# Patient Record
Sex: Male | Born: 1960 | Race: White | Hispanic: No | Marital: Married | Smoking: Former smoker
Health system: Western US, Academic
[De-identification: ages and names within clinical notes are randomized; demographics above are authoritative.]

## PROBLEM LIST (undated history)

## (undated) DIAGNOSIS — I10 Essential (primary) hypertension: Secondary | ICD-10-CM

## (undated) DIAGNOSIS — E079 Disorder of thyroid, unspecified: Secondary | ICD-10-CM

## (undated) DIAGNOSIS — R569 Unspecified convulsions: Secondary | ICD-10-CM

## (undated) DIAGNOSIS — K219 Gastro-esophageal reflux disease without esophagitis: Secondary | ICD-10-CM

## (undated) DIAGNOSIS — F319 Bipolar disorder, unspecified: Secondary | ICD-10-CM

## (undated) HISTORY — PX: CERVICAL SPINE SURGERY: SHX589

## (undated) HISTORY — PX: BACK SURGERY: SHX140

## (undated) HISTORY — PX: OTHER SURGICAL HISTORY: SHX169

---

## 2012-11-09 ENCOUNTER — Ambulatory Visit: Payer: Medicare Other | Attending: NEUROLOGY | Admitting: NEUROLOGY

## 2012-11-09 VITALS — BP 116/78 | HR 93 | Temp 96.5°F | Resp 20 | Ht 68.0 in | Wt 211.0 lb

## 2012-11-09 DIAGNOSIS — Z9889 Other specified postprocedural states: Secondary | ICD-10-CM | POA: Insufficient documentation

## 2012-11-09 DIAGNOSIS — G40919 Epilepsy, unspecified, intractable, without status epilepticus: Secondary | ICD-10-CM | POA: Insufficient documentation

## 2012-11-09 MED ORDER — ZONISAMIDE 100 MG CAPSULE
200.0000 mg | ORAL_CAPSULE | Freq: Two times a day (BID) | ORAL | Status: DC
Start: 2012-11-09 — End: 2013-03-21

## 2012-11-09 MED ORDER — LEVETIRACETAM 1,000 MG TABLET
1.0000 | ORAL_TABLET | Freq: Two times a day (BID) | ORAL | Status: DC
Start: 2012-11-09 — End: 2012-11-18

## 2012-11-09 MED ORDER — DIVALPROEX 500 MG TABLET,DELAYED RELEASE
1000.0000 mg | DELAYED_RELEASE_TABLET | Freq: Two times a day (BID) | ORAL | Status: DC
Start: 2012-11-09 — End: 2013-03-28

## 2012-11-09 NOTE — Addendum Note (Signed)
Addended by: Lovie Macadamia on: 11/09/2012 06:45 PM     Modules accepted: Orders

## 2012-11-09 NOTE — Nursing Note (Signed)
>>   [facility]     Tue Nov 09, 2012  2:39 PM  Vital signs taken, allergies verified, screened for pain. Pt is a new pt, no refills are needed at this time. Oliver Hum MA II

## 2012-11-09 NOTE — Progress Notes (Addendum)
PATIENT:  Peter Deleon  MRN:         [id number]  DATE:        11/09/2012  [address]  [place] [address]  23-Apr-1960  [phone] (home)     PCP: Stephannie Peters, MD  [address]  [place] [address]    Dear  Dr. Stephannie Peters, MD,     Our mutual patient, Khup Sapia, was seen today at the [facility] in new referral for epilepsy and establish care at [facility].    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 Helene Shoe. Patient is here to establish care for epilepsy and higher level of care for managing the VNS. Patient currently cannot use the magnet now because of a coughing fit, causing the VNS to be unusable. Also feels very drowsy all day (which patient believes is probably from depakote).     In 1964, patient was 24 months old and had his first seizure. Patient had strept throat 3 months earlier and thought to have led to seizures. Parents described tremoring on the L side of body. This was treated with PHB but "did not tolerate it". Patient was unmedicated for 5 years afterwards because seizure may not have been recognized. Later childhood, patient felt something was wrong and described "drifting off", "dizzy", "seeing things out of the body". During his events, patient could remember what was spoken and knows the answer, but cannot respond. Postictally, he has a great need to sleep.     Per wife, before VNS, patient had blank stare and would clench hands, then would clap them. After VNS, patient grinds teeth and has a blank stare. The wife has noted that his L hand seems like it is grabbing things in the beginning rather than the R hand. VNS has stopped bouts of seizures from 3-4 to 1 now.     Currently, patient has monthly seizures lasting 20 seconds.     1. Seizure risk factors:    Complication of birth or early development:  strept throat  Significant head trauma: No   Febrile convulsion: No.   CNS infection: No   Family history of epilepsy: No.   Brain tumor: No.   Stroke: No.   Prior neurosurgery: No    2. Seizure triggers:     Extreme stress   Lack of sleep   Sick   Hot temperatures    3.  Auras   Prior to seizure, he senses terror several hours, different tastes, dream-like state, foggy surroundings.     5. AED history:     Depakote 1000 mg BID   Keppra 1000 mg BID   Zonegran 200 mg BID    5. Failed AED history:     PHB - unsure why it was stopped   Dilantin - unsure why it was stopped   Tegretol - not effective   mysolin   lamictal - unsure why it was stopped     Has not tried topamax    Per OSH report, patient has had an EEG 12/2009 which was reportedly normal but no records are available. EEG 11/2011 was abnormal showing left hemispheric sharp and slow activity with phase reversal. He has been offered L temporal epilepsy surgery in the past but decided against it. This was offered by Dr. Isaiah Serge of [facility].  Patient was afraid of losing his memory and language because a relative had the same problem and had complications afterwards.     REVIEW OF SYSTEMS:  ROS was performed and is negative except for those items noted in the HPI and any additional items noted here:   Chest pain with VNS - 2 years. Present all the time but not triggered by VNS turning on.     PAST MEDICAL & SURGICAL HISTORY:  No past medical history on file.  No past surgical history on file.  Spondylolisthesis  Classic migranie  Dermatophytosis  Diverticulosis  Hyperplasia of prostate  Lumbar disk rupture  Major depressive disorder  Otitis  Panic disorder  Hearing loss  Bipolar    MEDICATIONS:  No current outpatient prescriptions on file prior to visit.     No current facility-administered medications on file prior to visit.   depakote 1000 mg BID  Fluvoxamine 50 mg 1 T am and 2T pm  keppra 500 mg BID  Melatonin 3 mg daily  Nabumetone 750 mg BID  Omeprazole 20  daily  Venlafaxine 75 mg daily  zonegran 200 mg BID    ALLERGIES:  Allergies not on file    FAMILY HISTORY:  Maternal great grandmother with seizure  Mother:  Deceased from lung cancer    SOCIAL HISTORY:   History     Social History    Marital Status: MARRIED     Spouse Name: N/A     Number of Children: N/A    Years of Education: N/A     Occupational History    Not on file.     Social History Main Topics    Smoking status: Not on file    Smokeless tobacco: Not on file    Alcohol Use: Not on file    Drug Use: Not on file    Sexually Active: Not on file     Other Topics Concern    Not on file     Social History Narrative    No narrative on file   no alcohol, no drugs, former smoking > 10 years    EXAMINATION:    Vitals:  Temp: 35.8 C (96.5 F) (09/23 1438)  Temp src: Tympanic (09/23 1438)  Pulse: 93 (09/23 1438)  BP: 116/78 mmHg (09/23 1438)  Resp: 20 (09/23 1438)  SpO2: --  Height: 172.7 cm (5\' 8" ) (09/23 1438)  Weight: 95.7 kg (210 lb 15.7 oz) (09/23 1438)    General Physical Exam:  GENERAL: NAD  HEENT: MMM. OP clear. No carotid bruits. Neck supple. Scar on the R from neck surgery. Scar on the R from VNS leads.  HEART: RRR, no murmurs/rubs/gallops  Chest: VNS scar on the L,     Neurological Exam:  Mental status  State: Alert, oriented to self, place, time. Appropriate affect and behavior.  Use of language: Fluent output, comprehension intact, repetition intact    CRANIAL NERVES:  CN 2:        Pupils 39mm->3mm, equal round reactive to light.  Visual fields intact to confrontation testing bilaterally.    CN 3,4,6:  EOM intact with 8-9 beats of horizontal nystagmus.  CN 5:        Sensation to LT intact bilaterally.  Sensation to pinprick intact bilaterally.  Masseter strength full bilaterally.  CN 7:        Symmetric smile, brow raise, and eye closure.  Eye closure strength full.  CN 8:        Hearing  symmetric to finger rub.  CN 9, 10:  Palate elevation midline.  CN 11:      SCM strength full bilaterally.   Trapezius strength full bilaterally.  CN 12:      Tongue protrusion midline.     Motor:  Bulk: No atrophy  Tone: Normal tone, no spasticity or rigidity   Pronator drift: none     Deltoid Biceps Triceps BR Wrist Flexors Wrist Extensors Hand Intrinsics Grip   R 5/5 5/5 5/5 5/5 5/5 5/5 5/5 5/5   L 5/5 5/5 5/5 5/5 5/5 5/5 5/5 5/5      Hip Flexors Hip Ext Knee Flexors Knee Ext Dorsi- Flexion Plantar- Flexion   R 5/5 5/5 5/5 5/5 5/5 5/5   L 5/5 5/5 5/5 5/5 5/5 5/5     Sensory:  Light touch:  Intact in all extremities  Vibration:  Intact in distal extremities  Temperature:  Intact in all extremities    Reflexes:   Biceps Triceps BR  Patella   R 2+ 2+ 2+ 2+   L 2+ 2+ 2+ 2+     Coordination:  F-to-N:  Intention tremors  H-to-S:  Intact bilaterally    Gait/Stance:  Romberg Sign:  positive  Gait: rises without difficulty, normal stable narrow based gait, symmetric steps, balance intact  Tandem gait: unable     DIAGNOSTIC STUDIES:  No results found for any previous visit.  Vagal Nerve Stimulator Interrogation, 11/09/2012 @ 3:09 PM    Settings  Output current:  1.00 mA  Signal frequency:   30 Hz  Pulse width:  130 uSec  On time:  30 sec    Off time:  5.0 min     Magnet triggered current:  1.25 mA  Magnet on time:  30 sec  Magnet pulse width:  500 uSec    System Diagnostics  Communication:  ok  Output status:  ok  Lead impedence:  Ok 3255 ohms  Output current:  Ok 1.0 mA    Magnet was activated and patient had violent coughing for 30 seconds    New Settings 11/09/2012   Output current:  1.00 mA  Signal frequency:   30 Hz  Pulse width:  130 uSec  On time:  30 sec    Off time:  5.0 min     Magnet triggered current:  0.75 mA  Magnet on time:  30 sec  Magnet pulse width:  250 uSec      SUMMARY & IMPRESSION:  This is a 52yr-old right-handed male with a past medical history of bipolar disorder, depression, back surgery with titanium rod, neck surgery with bone grafts, epilepsy s/p VNS placement in 11/04/10 who is here to establish care  for medically intractable epilepsy. VNS was modified today to tolerable magnet levels. 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 Extensive discussions about the benefits and risks of resection were made with the patient. However, he is hesitant. Patient will let us know if he wants to pursue a surgical workup at this point. In the meantime, patient will try to obtain outside records (MRI and EEG).      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   - blood tests including VPA, KEP, ZNG levels, CBC, LFT, BMP  - outpatient routine EEG  - outpatient MRI brain seizure protocol (titanium back rods are ok as they are not ferrous)    Return to clinic in 3 month(s).    Patient was seen and discussed with attending Dr. Trilby Drummer who agrees with the above stated findings and plan of care.     Sincerely,  Lovie Macadamia, MD  Resident  Department of Neurology    Neurology Attending    This patient was seen, evaluated, and care plan was developed with the resident. I agree with the findings and plan as outlined in the resident's note above.    Teodora Medici MD

## 2012-11-17 ENCOUNTER — Telehealth: Payer: Self-pay | Admitting: NEUROLOGY

## 2012-11-17 NOTE — Telephone Encounter (Signed)
Patient calling in to advise doctor that he gave mistakenly reported the wrong dosage for Keppra.  Patient states that he in fact takes 500mg  2x day to equal 1000mg  daily and NOT 1000mg  2x day to equal 2000mg  a day.  Patient states he is cutting tabs in half to keep dosage at 1000mg  daily.    Best number to be reached [phone]    Gerilyn Nestle

## 2012-11-18 MED ORDER — LEVETIRACETAM 500 MG TABLET
500.0000 mg | ORAL_TABLET | Freq: Two times a day (BID) | ORAL | Status: DC
Start: 2012-11-18 — End: 2013-03-21

## 2012-11-18 NOTE — Telephone Encounter (Signed)
Call to patient, he would like to clarify that at office visit he stated he the wrong dose of Keppra he had been taking. He is taking Keppra 500 mg BID. Dr. Lissa Merlin had ordered Keppra 1000mg  BID. Patient states he is cutting the keppra in half to take 500 mg BID. He wanted Dr. Doyle Askew to ne aware.Kathyrn Lass

## 2012-11-18 NOTE — Telephone Encounter (Signed)
Thank you for notifying. I have made the changes.

## 2012-11-25 ENCOUNTER — Encounter: Payer: Self-pay | Admitting: NURSE PRACTITIONER

## 2012-11-25 NOTE — Progress Notes (Signed)
Received copy of labs from [facility] drawn 11/17/12 at 10:01 am    CBC w/diff:  WNL  BCP WNL  LFT's WNL  VPA level 49  ZNS pending  LEV pending

## 2012-12-02 ENCOUNTER — Telehealth: Payer: Self-pay | Admitting: NEUROLOGY

## 2012-12-02 NOTE — Telephone Encounter (Signed)
Patient calling in to request orders for EEG and MRI be sent to [facility] in [place]. [phone]    Gerilyn Nestle

## 2012-12-02 NOTE — Telephone Encounter (Signed)
Faxed EEG Order to [facility] ph: [phone] fax: [phone].  Received confirmation that fax has been received.

## 2012-12-06 NOTE — Telephone Encounter (Signed)
Received a call from [facility] EEG coordinator stating they to not do outpatient EEGs.  Contacted and spoke to patient informing him of [facility]'s response.  Patient would like to try and find a facility near home.  Mailed Routine EEG order to patient to take to different facilities near home.  Confirmed patient's address and placed order in outgoing mail.

## 2012-12-20 ENCOUNTER — Telehealth: Payer: Self-pay | Admitting: NEUROLOGY

## 2012-12-20 NOTE — Telephone Encounter (Signed)
Patient calling in to request a replacement magnet for his VNS.  Requesting it be sent to him home in [place] if this is possible.        Gerilyn Nestle

## 2012-12-21 NOTE — Telephone Encounter (Addendum)
Please mail VNS magnet box to pt's home.  Thanks, Raynelle Fanning

## 2012-12-24 NOTE — Telephone Encounter (Signed)
Magnet placed in outgoing mail.Peter Deleon

## 2013-03-21 ENCOUNTER — Telehealth: Payer: Self-pay | Admitting: NEUROLOGY

## 2013-03-21 MED ORDER — ZONISAMIDE 100 MG CAPSULE
200.0000 mg | ORAL_CAPSULE | Freq: Two times a day (BID) | ORAL | Status: DC
Start: 2013-03-21 — End: 2014-07-14

## 2013-03-21 MED ORDER — LEVETIRACETAM 500 MG TABLET
500.0000 mg | ORAL_TABLET | Freq: Two times a day (BID) | ORAL | Status: DC
Start: 2013-03-21 — End: 2014-03-16

## 2013-03-21 NOTE — Telephone Encounter (Signed)
This is a  REFILL AUTHORIZATION for zonisamide 100 mg cap, 2 caps BID.  Pt's name and birthdate verified.  Pt was last seen by Dr. Trilby DrummerSeyal on 11/09/12. Next appointment is not yet scheduled for followup. Last office visit and medication list reviewed. Prescription filled for #240, with  6 refills.  Prescription sent to [facility] in [place].    This is a  REFILL AUTHORIZATION for levetiracetam 500 mg , 1 BID.  Pt's name and birthdate verified.  Last office visit and medication list reviewed. Prescription filled for #60, with  11 refills.  Prescription sent to [facility] in [place].    Bernadene PersonJanet Bess Saltzman, RN

## 2013-03-28 ENCOUNTER — Telehealth: Payer: Self-pay | Admitting: NEUROLOGY

## 2013-03-28 MED ORDER — DIVALPROEX 500 MG TABLET,DELAYED RELEASE
1000.0000 mg | DELAYED_RELEASE_TABLET | Freq: Two times a day (BID) | ORAL | Status: DC
Start: 2013-03-28 — End: 2014-04-18

## 2013-03-28 NOTE — Telephone Encounter (Signed)
This is a  REFILL AUTHORIZATION for depakote 500 mg delayed release tab, 2 tab BID.  Pt's name and birthdate verified.  Pt was last seen by Dr. Trilby DrummerSeyal on 11/09/12. Next appointment is not yet scheduled for 3 month followup. Last office visit and medication list reviewed. Prescription filled for #120, with  11 refills.  Prescription sent to [facility] in [place].    Bernadene PersonJanet Brick Ketcher, RN

## 2013-06-23 ENCOUNTER — Ambulatory Visit: Payer: Medicare Other | Attending: NURSE PRACTITIONER | Admitting: NURSE PRACTITIONER

## 2013-06-23 ENCOUNTER — Ambulatory Visit (HOSPITAL_BASED_OUTPATIENT_CLINIC_OR_DEPARTMENT_OTHER)
Admission: RE | Admit: 2013-06-23 | Discharge: 2013-06-23 | Disposition: A | Discharge status: Home / Self Care | Payer: Medicare Other | Source: Ambulatory Visit | Attending: NEUROLOGY | Admitting: NEUROLOGY

## 2013-06-23 ENCOUNTER — Ambulatory Visit: Payer: Medicare Other

## 2013-06-23 ENCOUNTER — Encounter: Payer: Self-pay | Admitting: NURSE PRACTITIONER

## 2013-06-23 VITALS — BP 127/90 | HR 92 | Temp 96.4°F | Resp 16 | Ht 68.0 in | Wt 210.3 lb

## 2013-06-23 DIAGNOSIS — G40909 Epilepsy, unspecified, not intractable, without status epilepticus: Secondary | ICD-10-CM | POA: Insufficient documentation

## 2013-06-23 DIAGNOSIS — R569 Unspecified convulsions: Principal | ICD-10-CM

## 2013-06-23 DIAGNOSIS — Z462 Encounter for fitting and adjustment of other devices related to nervous system and special senses: Secondary | ICD-10-CM | POA: Insufficient documentation

## 2013-06-23 MED ORDER — GADODIAMIDE 5 MMOL/10 ML (287 MG/ML) INTRAVENOUS SYRINGE
10.0000 mL | INJECTION | INTRAVENOUS | Status: AC
Start: 2013-06-23 — End: 2013-06-23
  Administered 2013-06-23: 10 mL via INTRAVENOUS

## 2013-06-23 NOTE — Progress Notes (Addendum)
Pt has had MRI brain. Needs VNS reactivated.    Vagal Nerve Stimulator Settings:  0.0 output currentReset to 1.0 mA   30 signal frequency  130 pulse width  30 on time (sec)  5.0 off time (min)  0.0 Magnet output current Reset to 0.75 mA   30 Magnet on time  250 Magnet pulse width    Pt tolerated settings without difficulty.    5 minutes spent adjusting VNS device.    Dani GobbleJulie Ann Witt Plitt, NP

## 2013-06-23 NOTE — Progress Notes (Addendum)
Pt here for MRI brain.  Needs VNS inactivated for scan.    Vagal Nerve Stimulator Settings:  1.0 output current   Reset to 0.0 mA before scan  30 signal frequency  130 pulse width  30 on time (sec)  5.0 off time (min)  0.75 Magnet output current  Reset to 0.0 mA before scan  30 Magnet on time  250 Magnet pulse width    5 minutes spent adjusting VNS.  Dani GobbleJulie Ann Annastyn Silvey, NP

## 2013-06-23 NOTE — Nursing Note (Signed)
>>   [facility]     Thu Jun 23, 2013 10:10 AM  Vital signs taken, allergies verified, screened for pain. Oliver HumKandis [name] MA II

## 2013-06-24 ENCOUNTER — Telehealth: Payer: Self-pay | Admitting: Neurology

## 2013-06-24 NOTE — Telephone Encounter (Signed)
Called patient to tell him that his MRI exam was abnormal. He did not pick up so I left a message for him to call back our clinic on Monday regarding his MRI results. I wanted to discuss that his MRI showed some structural problem with his right hippocampus and amygdala. In our last clinic visit on 11/09/12, we suspected that he had L temporal epilepsy and may be a candidate for surgical resection. He may need EEG for better clarification of his epilepsy focus, but I will defer his management to Dr. Trilby DrummerSeyal.     Wanda PlumpJon Tyronica Truxillo MD

## 2013-06-27 ENCOUNTER — Telehealth: Payer: Self-pay | Admitting: Neurology

## 2013-06-27 NOTE — Telephone Encounter (Addendum)
Received a call from the patient in regards to the message below,Advised the patient will Page  the physician once we receive a call back clinic will  contact the patient once clinic receive a call with physician on the line   Page  was made at 8:56 am  pending a call back from the physician  At this time.  Thank you,  Revonda StandardMaria Elena Rojas  [facility]  Referral coordinator [facility]    [phone]  [phone]

## 2013-06-27 NOTE — Telephone Encounter (Signed)
Patient called back and I discussed that there is an abnormality on his recent MRI (R hippocampus and amygdala). He tells me that his seizures start with L sided funny feeling or fear which would suggest a R temporal seizure and not a L temporal seizure. He is complaining of sedation and cognitive issues with his current regimen. I broached the subject of surgery but patient is not interested.  I asked him to schedule a follow up visit with the epilepsy clinic.

## 2013-08-23 ENCOUNTER — Encounter: Payer: Self-pay | Admitting: NEUROLOGY

## 2013-09-20 ENCOUNTER — Ambulatory Visit: Payer: Medicare Other | Admitting: NEUROLOGY

## 2013-11-22 ENCOUNTER — Ambulatory Visit: Payer: Medicare Other | Attending: NEUROLOGY | Admitting: NEUROLOGY

## 2013-11-22 ENCOUNTER — Ambulatory Visit: Payer: Medicare Other

## 2013-11-22 VITALS — BP 128/86 | HR 85 | Temp 96.6°F | Resp 16 | Ht 68.0 in | Wt 223.1 lb

## 2013-11-22 DIAGNOSIS — G40219 Localization-related (focal) (partial) symptomatic epilepsy and epileptic syndromes with complex partial seizures, intractable, without status epilepticus: Secondary | ICD-10-CM | POA: Insufficient documentation

## 2013-11-22 DIAGNOSIS — Z79899 Other long term (current) drug therapy: Secondary | ICD-10-CM | POA: Insufficient documentation

## 2013-11-22 DIAGNOSIS — G40919 Epilepsy, unspecified, intractable, without status epilepticus: Principal | ICD-10-CM

## 2013-11-22 LAB — HEPATIC FUNCTION PANEL
ALANINE TRANSFERASE (ALT): 30 U/L (ref 6–63)
ALBUMIN: 4.1 g/dL (ref 3.4–4.8)
ALKALINE PHOSPHATASE (ALP): 61 U/L (ref 35–115)
ASPARTATE TRANSAMINASE (AST): 24 U/L (ref 15–43)
BILIRUBIN TOTAL: 0.5 mg/dL (ref 0.3–1.3)
PROTEIN: 6.9 g/dL (ref 6.3–8.3)

## 2013-11-22 LAB — VALPROATE: VALPROATE: 63 mg/L (ref 50–100)

## 2013-11-22 MED ORDER — CALCIUM 500 MG (AS CARBONATE)-VITAMIN D3 5 MCG (200 UNIT) TABLET
1.0000 | ORAL_TABLET | Freq: Two times a day (BID) | ORAL | Status: AC
Start: 2013-11-22 — End: 2014-11-17

## 2013-11-22 NOTE — Nursing Note (Signed)
Vital signs taken, allergies verified, screened for pain. Preferred pharmacy selected. Refills needed? No.Radonna Bracher MA II

## 2013-11-22 NOTE — Progress Notes (Addendum)
PATIENT:  Peter Deleon  MRN:         [id number]  DATE:        11/22/2013  [address]  [place] [address]  22-Nov-1960  [phone] (home)     PCP: Stephannie Peters, MD  [address]  [place] [address]    Dear  Dr. Stephannie Peters, MD,     Our mutual patient, Peter Deleon, was seen today at the [facility] as a follow up for seizure management. He was last seen at this clinic on 11/10/12. He was driven to clinic by a friend.    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 Helene Shoe. Patient is here as a follow up for seizure management.     In 1964, patient was 53 months old and had his first seizure with tremoring on the L side of body. This was treated with PHB but he "did not tolerate it". Patient was unmedicated for 5 years afterwards because seizure may not have been recognized. Later childhood, patient felt something was wrong and described "drifting off", "dizzy", "seeing things out of the body". During his events, patient could remember what was spoken and knows the answer, but cannot respond. Postictally, he has a great need to sleep. After several medication changes and installation of VNS, his seizure clusters were dropped from 3-4/month to 1/month now. Each cluster has 3-4 seizures spread out over 3-4 days.    On 06/24/13, patient called in regarding an abnormal MRI brain report. I discussed there is R hippocampus and amygdala. He tells me that his seizures start with a funny feeling over the L sided. He also has an OSH EEG 11/2011 which showed abnormal slowing of the left hemispheric sharp and slow activity with phase reversal. He has been offered L temporal epilepsy surgery in the past but decided against it. This was offered by Dr. Isaiah Serge of [facility]. Patient was afraid of losing his memory and language because a relative had the  same problem and had complications afterwards.     In the interim, from last year's visit, patient continues to have monthly seizures. VNS is used "a lot" in the past couple days because he feels like there is an impending seizure. He has a sense of anxiety and fear of "being alienated", then suddenly removed from his usual place. Once seizures begin, he has a sense of terror and it is too late to try the VNS. Patient can hear things and remember things in "slow motion."  He can try to talk but it comes out mumbled. Witnesses say both hands wring and move, swallowing, grinding teeth, eyes move back and forth, no particular eye deviation.     He is concerned that he is sleeping an "awful lot", feeling tired and sleepy throughout day. Snores throughout day. Falls asleep during activities.     1. Seizure risk factors:   Complication of birth or early development: strept throat  Significant head trauma: No   Febrile convulsion: No.   CNS infection: No   Family history of epilepsy: No.   Brain tumor: No.   Stroke: No.   Prior neurosurgery: No    2. Seizure triggers:   Extreme stress  Lack of sleep  Sick  Hot temperatures    3. Auras  Prior to seizure, he  senses terror several hours, different tastes, dream-like state, foggy surroundings.     5. AED history:    Depakote 1000 mg BID  Keppra 1000 mg BID  Zonegran 200 mg BID    5. Failed AED history:    PHB - unsure why it was stopped  Dilantin - unsure why it was stopped  Tegretol - not effective  mysolin  lamictal - unsure why it was stopped  Has not tried topamax    Per OSH report, patient has had an EEG 12/2009 which was reportedly normal but no records are available. EEG 11/2011 was abnormal showing left hemispheric sharp and slow activity with phase reversal. He has been offered L temporal epilepsy surgery in the past but decided against it. This was offered by Dr. Isaiah SergeBertram of [facility]. Patient was afraid of losing his memory and language because a relative had the same  problem and had complications afterwards.     REVIEW OF SYSTEMS:  ROS was performed and is negative except for those items noted in the HPI and any additional items noted here:     PAST MEDICAL & SURGICAL HISTORY:  Spondylolisthesis  Classic migranie  Dermatophytosis  Diverticulosis  Hyperplasia of prostate  Lumbar disk rupture  Major depressive disorder  Otitis  Panic disorder  Hearing loss  Bipolar    MEDICATIONS:  Current Outpatient Prescriptions on File Prior to Visit   Medication Sig Dispense Refill    Divalproex (DEPAKOTE) 500 mg Delayed Release Tablet Take 2 tablets by mouth 2 times daily. 120 tablet 11    LevETIRAcetam (KEPPRA) 500 mg Tablet Take 1 tablet by mouth 2 times daily. 60 tablet 11    Zonisamide (ZONISAMIDE) 100 mg Capsule Take 2 capsules by mouth 2 times daily. 240 capsule 6     No current facility-administered medications on file prior to visit.   depakote 1000 mg BID  keppra 500 mg BID  zonegran 200 mg BID    levoxyl 75 mg daily  relafen 750 mg bid  prilosec 20 mg  flomax 0.4 mg  Abilify 2 mg daily - started 6 months ago  Luvox 50 mg daily  effexor 100 daily, 50 mg daily - started 1 year ago  Tylenol 3  MVI    ALLERGIES:  Allergies   Allergen Reactions    Pcn (Penicillin) [Penicillins] Rash    Vicodin [Hydrocodone-Acetaminophen] Anxiety       FAMILY HISTORY:  No family history on file.  Maternal great grandmother with seizure  Mother: Deceased from lung cancer    SOCIAL HISTORY:   History     Social History    Marital Status: MARRIED     Spouse Name: N/A     Number of Children: N/A    Years of Education: N/A     Occupational History    Not on file.     Social History Main Topics    Smoking status: Former Smoker    Smokeless tobacco: Former NeurosurgeonUser      Comment: Quit cigs 13 years ago, quit smokeless tobacco 30 years ago    Alcohol Use: Not on file    Drug Use: Not on file    Sexual Activity: Not on file     Other Topics Concern    Not on file     Social History Narrative    No  narrative on file   no alcohol, no drugs, former smoking > 10 years    EXAMINATION:    Vitals:  Temp:  35.9 C (96.6 F) (10/06 1040)  Temp src: Tympanic (10/06 1040)  Pulse: 85 (10/06 1040)  BP: 128/86 mmHg (10/06 1040)  Resp: 16 (10/06 1040)  SpO2: --  Height: 172.7 cm (5\' 8" ) (10/06 1040)  Weight: 101.2 kg (223 lb 1.7 oz) (10/06 1040)    General Physical Exam:  GENERAL: NAD  HEENT: MMM. OP clear. No carotid bruits. Neck supple. Scar on the R from neck surgery. Scar on the R from VNS leads.  HEART: RRR, no murmurs/rubs/gallops  Chest: VNS scar on the L chest    Neurological Exam:  Mental status  State: Alert, oriented to self, place, time. Appropriate affect and behavior.  Use of language: Fluent output, comprehension intact, repetition intact    CRANIAL NERVES:  CN 2: Pupils 22mm->3mm, equal round reactive to light. Visual fields intact to confrontation testing bilaterally.   CN 3,4,6: EOM intact with 8-9 beats of horizontal nystagmus.  CN 5: Sensation to LT intact bilaterally. Sensation to pinprick intact bilaterally. Masseter strength full bilaterally.  CN 7: Symmetric smile, brow raise, and eye closure. Eye closure strength full.  CN 8: Hearing symmetric to finger rub.  CN 9, 10: Palate elevation midline.  CN 11: SCM strength full bilaterally. Trapezius strength full bilaterally.  CN 12: Tongue protrusion midline.     Motor:  Bulk: No atrophy  Tone: Normal tone, no spasticity or rigidity   Fine tremors in the hands with arms outstretched  Pronator drift: none   Deltoid  Biceps  Triceps  BR  Wrist Flexors  Wrist Extensors  Hand Intrinsics  Grip    R  5/5  5/5  5/5  5/5  5/5  5/5  5/5  5/5    L  5/5  5/5  5/5  5/5  5/5  5/5  5/5  5/5     Hip Flexors  Hip Ext  Knee Flexors  Knee Ext  Dorsi- Flexion  Plantar- Flexion    R  5/5  5/5  5/5  5/5  5/5  5/5    L  5/5  5/5  5/5  5/5  5/5  5/5      Sensory:  Light touch: Intact in all extremities  Vibration: Intact in distal extremities  Temperature: Intact in all  extremities    Reflexes:   Biceps  Triceps  BR  Patella    R  2+  2+  2+  2+    L  2+  2+  2+  2+      Coordination:  F-to-N: Intention tremors  H-to-S: Intact bilaterally    Gait/Stance:  Romberg Sign: positive  Gait: rises without difficulty, normal stable narrow based gait, symmetric steps, balance intact  Tandem gait: unable       DIAGNOSTIC STUDIES:  No results found for any previous visit.  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    VPA 49 on OSH lab test 11/29/12    Adverse events risk score 60    NDDI-E 14    SUMMARY & IMPRESSION:  This  is a 53yr-old right-handed male with a past medical history of bipolar disorder, depression, back surgery with titanium rod, neck surgery with bone grafts, epilepsy s/p VNS placement in 11/04/10 who is here for management of his epilepsy. Patient still having 3-4 seizures a month but they are clustered in 3-4 consecutive days. He suffers no side effects from the AEDs except for mild intention tremors. Several AEDs have been attempted in the past with varying success.  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 Patient is interested in surgery at this point. We discussed in detail the steps that are needed for resection today. Patient will follow up with [place] after those tests are completed.   History also suggests possible OSA.    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     AAN EPILEPSY QUALITY MEASURES REVIEW:    MEASURE FREQUENCY DATE LAST DOCUMENTED  COMMENT   1. Seizure type & current seizure frequency All visits 11/22/2013     2. Documentation of etiology of epilepsy or epilepsy syndrome All visits 11/22/2013     3. EEG results reviewed, requested, or test ordered Initial visits 11/22/2013     4. MRI/CT scan reviewed, requested, or scan ordered Initial visits 11/22/2013     5. Querying and counseling about antiepileptic drug side effects All visits 11/22/2013     6. Surgical therapy referral consideration for intractable epilepsy q3 years for patients with intractable epilepsy 11/22/2013     7. Counseling about epilepsy specific safety issues q1 year minimal 11/22/2013     8. Counseling for women of childbearing potential with epilepsy q1 year minimal for women age 1-44 na      Return to clinic in 3 month(s).    Patient was seen and discussed with attending Dr. Trilby Drummer who agrees with the above stated findings and plan of care.     Sincerely,  Lovie Macadamia, MD  Resident  Department of Neurology      Neurology Attending    This patient was seen, evaluated, and care plan was developed with the resident. I agree with the findings and plan as outlined in the resident's note above. The total visit time today was 30 minutes and more than 50% of the time was spent in counselling the patient regarding his diagnosis and future management.    Teodora Medici MD

## 2013-11-25 LAB — KEPPRA (LEVETIRACETAM)

## 2013-11-25 LAB — ZONISAMIDE: ZONISAMIDE: 12 ug/mL

## 2013-12-02 ENCOUNTER — Telehealth: Payer: Self-pay | Admitting: NEUROLOGY

## 2013-12-02 NOTE — Telephone Encounter (Signed)
Patient calling in to advise he has an appointment today at 11am.  He needs a clearance for dental work stating it is okay for the procedure to be done with his VNS implant.  Patient states the office has faxed over a release 3 days ago.  Please contact [facility] [phone]      Gerilyn NestleLeah Brown, [facility]  Neurology, [facility]  [phone] patient line  [phone] backline

## 2013-12-05 NOTE — Telephone Encounter (Signed)
Patient calling in to check on status of clearance forms for dentist.  Patient states he has an appointment tomorrow, Tuesday at 11am.  Advised Dr. Trilby DrummerSeyal will be in clinic on Tuesday and NP, Raynelle FanningJulie will talk with doctor for signature.  Also advised patient of timeframe for requesting forms to be completed.      Gerilyn NestleLeah Brown, [facility]  Neurology, [facility]  [phone] patient line  [phone] backline

## 2013-12-06 NOTE — Telephone Encounter (Signed)
Please notify pt that dental clearance letter faxed to his dental office. Thanks, Raynelle FanningJulie

## 2013-12-06 NOTE — Telephone Encounter (Signed)
Patient notified      Leah Brown, [facility]  Neurology, [facility]  [phone] patient line  [phone] backline

## 2014-01-27 ENCOUNTER — Telehealth: Payer: Self-pay | Admitting: NEUROLOGY

## 2014-01-27 NOTE — Telephone Encounter (Addendum)
Contacted and spoke to patient offering to schedule the Video EEG.  Patient asked if this is part of the surgical evaluation.  Told patient yes.  Patient stated that he has decided not to have the surgery at this time, seizures well controlled with Implant.  Also told patient that a VET is also used for diagnostic purposes to help establish location of electrical brain waves.  I also told patient that we can hold off on scheduling if he wanted to talk to Dr. Trilby DrummerSeyal. Patient did state that this would be a helpful tool in the treatment.  Patient requested May 15, 2014.

## 2014-03-14 ENCOUNTER — Telehealth: Payer: Self-pay | Admitting: NEUROLOGY

## 2014-03-14 NOTE — Telephone Encounter (Signed)
Patient called and rescheduled the admission date from May 15, 2014 to Jul 17, 2014. Stating his spouse is having major surgery and will not be able make the appts with him.  He also wants to be availablee to help her in her recovery.

## 2014-04-18 ENCOUNTER — Other Ambulatory Visit: Payer: Self-pay | Admitting: NURSE PRACTITIONER

## 2014-04-18 MED ORDER — DIVALPROEX 500 MG TABLET,DELAYED RELEASE
1000.0000 mg | DELAYED_RELEASE_TABLET | Freq: Two times a day (BID) | ORAL | Status: DC
Start: 2014-04-18 — End: 2014-07-14

## 2014-04-18 NOTE — Telephone Encounter (Signed)
This is a  REFILL AUTHORIZATION for the faxed request for prescription Divalproex 500 mg, 2 tabs BID.      [name] RN/neurology

## 2014-04-26 ENCOUNTER — Ambulatory Visit: Payer: Medicare Other | Admitting: Clinical Neuropsychologist

## 2014-05-08 ENCOUNTER — Other Ambulatory Visit: Payer: Self-pay | Admitting: NEUROLOGY

## 2014-05-08 NOTE — Telephone Encounter (Signed)
This is a  REFILL AUTHORIZATION for Levetiracetam ( keppra)500mg  tablet, take 1 tablet orally BID.  Pt's name and birthdate verified. Pt was last seen by Dr. Lissa MerlinKuo on 11-22-13. Next appointment is not yet scheduled for 3 months f/u. Last office visit and medication list reviewed. Prescription filled for # 180 tablets, with  1 refills.  Prescription sent to [facility].  Medication refilled per Prescription Refill by Clinic RN Standardized Procedure Policy II-31.    TC to patient for clarification because last oV note : "cont Keppra 1000 mg BID ".  He verified 500mg  BID.    [facility] - please schedule for follow up or enlist in cancellation list. Please reinforce need to keep appointment. Kennon RoundsSally Kaileb Monsanto,RN/Neurology         Kennon RoundsSally Arlys Scatena,RN/Neurology

## 2014-07-10 ENCOUNTER — Inpatient Hospital Stay
Admission: RE | Admit: 2014-07-10 | Discharge: 2014-07-15 | DRG: 101 | Disposition: A | Discharge status: Home / Self Care | Payer: Medicare Other | Source: Ambulatory Visit | Attending: Neurology | Admitting: Neurology

## 2014-07-10 ENCOUNTER — Inpatient Hospital Stay
Admission: RE | Admit: 2014-07-10 | Discharge: 2014-07-10 | Disposition: A | Discharge status: Home / Self Care | Payer: Self-pay | Source: Ambulatory Visit

## 2014-07-10 DIAGNOSIS — Z87891 Personal history of nicotine dependence: Secondary | ICD-10-CM | POA: Insufficient documentation

## 2014-07-10 DIAGNOSIS — G40909 Epilepsy, unspecified, not intractable, without status epilepticus: Secondary | ICD-10-CM

## 2014-07-10 DIAGNOSIS — G4733 Obstructive sleep apnea (adult) (pediatric): Secondary | ICD-10-CM | POA: Diagnosis present

## 2014-07-10 DIAGNOSIS — Q048 Other specified congenital malformations of brain: Secondary | ICD-10-CM | POA: Insufficient documentation

## 2014-07-10 DIAGNOSIS — Z9689 Presence of other specified functional implants: Secondary | ICD-10-CM | POA: Diagnosis present

## 2014-07-10 DIAGNOSIS — F319 Bipolar disorder, unspecified: Secondary | ICD-10-CM | POA: Diagnosis present

## 2014-07-10 DIAGNOSIS — G40919 Epilepsy, unspecified, intractable, without status epilepticus: Principal | ICD-10-CM | POA: Diagnosis present

## 2014-07-10 LAB — BASIC METABOLIC PANEL
CALCIUM: 9.4 mg/dL (ref 8.6–10.5)
CARBON DIOXIDE TOTAL: 23 meq/L — AB (ref 24–32)
CHLORIDE: 105 meq/L (ref 95–110)
CREATININE BLOOD: 0.97 mg/dL (ref 0.44–1.27)
GLUCOSE: 117 mg/dL — AB (ref 70–99)
POTASSIUM: 4.2 meq/L (ref 3.3–5.0)
SODIUM: 139 meq/L (ref 135–145)
UREA NITROGEN, BLOOD (BUN): 16 mg/dL (ref 8–22)

## 2014-07-10 LAB — VALPROATE: VALPROATE: 40 mg/L — AB (ref 50–100)

## 2014-07-10 LAB — CBC WITH DIFFERENTIAL
BASOPHILS % AUTO: 0.3 %
BASOPHILS ABS AUTO: 0 10*3/uL (ref 0–0.2)
EOSINOPHIL % AUTO: 4.7 %
EOSINOPHIL ABS AUTO: 0.3 10*3/uL (ref 0–0.5)
HEMATOCRIT: 43.8 % (ref 41–53)
HEMOGLOBIN: 14.9 g/dL (ref 13.5–17.5)
LYMPHOCYTE ABS AUTO: 2.1 10*3/uL (ref 1.0–4.8)
LYMPHOCYTES % AUTO: 31 %
MCH: 29.9 pg (ref 27–33)
MCHC: 34.1 % (ref 32–36)
MCV: 87.7 UM3 (ref 80–100)
MONOCYTES % AUTO: 8.4 %
MONOCYTES ABS AUTO: 0.6 10*3/uL (ref 0.1–0.8)
MPV: 9.3 UM3 (ref 6.8–10.0)
NEUTROPHIL ABS AUTO: 3.8 10*3/uL (ref 1.80–7.70)
NEUTROPHILS % AUTO: 55.6 %
PLATELET COUNT: 175 10*3/uL (ref 130–400)
RDW: 13.7 U (ref 0–14.7)
RED CELL COUNT: 5 10*6/uL (ref 4.5–5.9)
WHITE BLOOD CELL COUNT: 6.8 10*3/uL (ref 4.5–11.0)

## 2014-07-10 LAB — HEPATIC FUNCTION PANEL
ALANINE TRANSFERASE (ALT): 41 U/L (ref 6–63)
ALBUMIN: 3.8 g/dL (ref 3.4–4.8)
ALKALINE PHOSPHATASE (ALP): 67 U/L (ref 35–115)
ASPARTATE TRANSAMINASE (AST): 34 U/L (ref 15–43)
BILIRUBIN DIRECT: 0.2 mg/dL (ref 0.0–0.2)
BILIRUBIN TOTAL: 0.8 mg/dL (ref 0.3–1.3)
PROTEIN: 6.2 g/dL — AB (ref 6.3–8.3)

## 2014-07-10 LAB — MAGNESIUM (MG): MAGNESIUM (MG): 2.1 mg/dL (ref 1.5–2.6)

## 2014-07-10 LAB — AMMONIA: AMMONIA: 91 umol/L — AB (ref 2–30)

## 2014-07-10 LAB — PHOSPHORUS (PO4): PHOSPHORUS (PO4): 3.4 mg/dL (ref 2.4–5.0)

## 2014-07-10 MED ORDER — VENLAFAXINE 37.5 MG TABLET
150.0000 mg | ORAL_TABLET | Freq: Every day | ORAL | Status: DC
Start: 2014-07-11 — End: 2014-07-15
  Administered 2014-07-11 – 2014-07-15 (×5): 150 mg via ORAL
  Filled 2014-07-10 (×5): qty 4

## 2014-07-10 MED ORDER — ZONISAMIDE 100 MG CAPSULE
100.0000 mg | ORAL_CAPSULE | Freq: Two times a day (BID) | ORAL | Status: DC
Start: 2014-07-10 — End: 2014-07-11
  Administered 2014-07-10 – 2014-07-11 (×2): 100 mg via ORAL
  Filled 2014-07-10 (×2): qty 1

## 2014-07-10 MED ORDER — LEVOTHYROXINE 75 MCG TABLET
75.0000 ug | ORAL_TABLET | Freq: Every day | ORAL | Status: DC
Start: 2014-07-11 — End: 2014-07-15
  Administered 2014-07-11 – 2014-07-15 (×5): 75 ug via ORAL
  Filled 2014-07-10 (×5): qty 1

## 2014-07-10 MED ORDER — LORAZEPAM 2 MG/ML INJECTION SOLUTION
1.0000 mg | INTRAMUSCULAR | Status: DC | PRN
Start: 2014-07-10 — End: 2014-07-15

## 2014-07-10 MED ORDER — CALCIUM 500 MG (AS CARBONATE)-VITAMIN D3 5 MCG (200 UNIT) TABLET
1.0000 | ORAL_TABLET | Freq: Two times a day (BID) | ORAL | Status: DC
Start: 2014-07-10 — End: 2014-07-15
  Administered 2014-07-10 – 2014-07-15 (×10): 1 via ORAL
  Filled 2014-07-10 (×9): qty 1

## 2014-07-10 MED ORDER — NAPROXEN 500 MG TABLET
500.0000 mg | ORAL_TABLET | Freq: Three times a day (TID) | ORAL | Status: DC | PRN
Start: 2014-07-10 — End: 2014-07-15

## 2014-07-10 MED ORDER — PANTOPRAZOLE 20 MG TABLET,DELAYED RELEASE
20.0000 mg | DELAYED_RELEASE_TABLET | Freq: Every day | ORAL | Status: DC
Start: 2014-07-11 — End: 2014-07-15
  Administered 2014-07-11 – 2014-07-15 (×5): 20 mg via ORAL
  Filled 2014-07-10 (×6): qty 1

## 2014-07-10 MED ORDER — HEPARIN, PORCINE (PF) 5,000 UNIT/0.5 ML INJECTION SYRINGE
5000.0000 [IU] | INJECTION | Freq: Three times a day (TID) | INTRAMUSCULAR | Status: DC
Start: 2014-07-10 — End: 2014-07-15
  Administered 2014-07-10 – 2014-07-14 (×12): 5000 [IU] via SUBCUTANEOUS
  Filled 2014-07-10 (×13): qty 1

## 2014-07-10 MED ORDER — FLUVOXAMINE 50 MG TABLET
50.0000 mg | ORAL_TABLET | Freq: Every day | ORAL | Status: DC
Start: 2014-07-10 — End: 2014-07-14
  Filled 2014-07-10 (×3): qty 1

## 2014-07-10 MED ORDER — DIVALPROEX 250 MG TABLET,DELAYED RELEASE
500.0000 mg | DELAYED_RELEASE_TABLET | Freq: Two times a day (BID) | ORAL | Status: DC
Start: 2014-07-10 — End: 2014-07-11
  Administered 2014-07-10 – 2014-07-11 (×2): 500 mg via ORAL
  Filled 2014-07-10 (×2): qty 2

## 2014-07-10 MED ORDER — DOCUSATE SODIUM 100 MG CAPSULE
100.0000 mg | ORAL_CAPSULE | Freq: Two times a day (BID) | ORAL | Status: DC
Start: 2014-07-10 — End: 2014-07-15
  Administered 2014-07-10 – 2014-07-15 (×9): 100 mg via ORAL
  Filled 2014-07-10 (×11): qty 1

## 2014-07-10 MED ORDER — ACETAMINOPHEN 325 MG TABLET
650.0000 mg | ORAL_TABLET | ORAL | Status: DC | PRN
Start: 2014-07-10 — End: 2014-07-10

## 2014-07-10 MED ORDER — ACETAMINOPHEN 325 MG TABLET
650.0000 mg | ORAL_TABLET | ORAL | Status: DC | PRN
Start: 2014-07-10 — End: 2014-07-15
  Administered 2014-07-11 – 2014-07-14 (×4): 650 mg via ORAL
  Filled 2014-07-10 (×4): qty 2

## 2014-07-10 MED ORDER — LEVETIRACETAM 500 MG TABLET
500.0000 mg | ORAL_TABLET | Freq: Two times a day (BID) | ORAL | Status: DC
Start: 2014-07-10 — End: 2014-07-12
  Administered 2014-07-10 – 2014-07-12 (×4): 500 mg via ORAL
  Filled 2014-07-10 (×4): qty 1

## 2014-07-10 MED ORDER — TAMSULOSIN 0.4 MG CAPSULE
0.4000 mg | ORAL_CAPSULE | Freq: Every day | ORAL | Status: DC
Start: 2014-07-10 — End: 2014-07-15
  Administered 2014-07-10 – 2014-07-14 (×5): 0.4 mg via ORAL
  Filled 2014-07-10 (×5): qty 1

## 2014-07-10 MED ORDER — MULTIVITAMIN TABLET
1.0000 | ORAL_TABLET | Freq: Every day | ORAL | Status: DC
Start: 2014-07-11 — End: 2014-07-15
  Administered 2014-07-11 – 2014-07-15 (×5): 1 via ORAL
  Filled 2014-07-10 (×5): qty 1

## 2014-07-10 MED ORDER — ARIPIPRAZOLE 2 MG TABLET
2.0000 mg | ORAL_TABLET | Freq: Every day | ORAL | Status: DC
Start: 2014-07-11 — End: 2014-07-14
  Administered 2014-07-11: 2 mg via ORAL
  Filled 2014-07-10 (×3): qty 1

## 2014-07-10 MED ORDER — ZONISAMIDE 100 MG CAPSULE
200.0000 mg | ORAL_CAPSULE | Freq: Two times a day (BID) | ORAL | Status: DC
Start: 2014-07-10 — End: 2014-07-10

## 2014-07-10 MED ORDER — DIVALPROEX 250 MG TABLET,DELAYED RELEASE
1000.0000 mg | DELAYED_RELEASE_TABLET | Freq: Two times a day (BID) | ORAL | Status: DC
Start: 2014-07-10 — End: 2014-07-10

## 2014-07-10 NOTE — Nurse Assessment (Signed)
ASSESSMENT NOTE    Note Started: 07/10/2014, 19:42     Initial assessment completed and recorded in EMR.  Report received from day shift nurse and orders reviewed. Pt admitted for continuous video EEG and telemonitoring. Pt has PMH of bipolar disorder, neck and back pain s/p surgery, OSA on nocturnal CPAP, and intractable epilepsy s/p VNS placement in September 2012. Upon assessment pt AOX4, in RA with clear lung sounds, moves all extremities well, ambulatory to the bathroom with constant supervision and steady on his feet, denies any numbness/tingling in extremities but does report lower back pain 4/10- offered pain medication but pt declined. Pt reports has chronic low back pain that stays at about 4/10 level. Pt oriented about seizure protocol- pressing the button and to always have someone go with him to the restroom. Seizure precautions maintained- padded side rails, suction and oxygen connected and functioning, and lights on. Will continue to monitor. Plan of Care reviewed and updated, discussed with patient.  Jake Sharkaissa Bryne Lindon, RN

## 2014-07-10 NOTE — Nurse Assessment (Signed)
ADMIT NURSING NOTE    Note Started: 07/10/2014, 16:56     Report received from EEG tech. Patient admitted at 1440 hours as a direct admit with no sitter at the bedside. Pt condition stable . patient oriented to room and unit. MD aware of patient's arrival on unit. Admission Assessment and Plan of Care initiated. Zebedee IbaMary Reyansh Kushnir, RN

## 2014-07-10 NOTE — Plan of Care (Signed)
Problem: Patient Care Overview (Adult)  Goal: Plan of Care Review  Pt currently having an EEG done at this time. Refused BIPAP as he doesn't use it every day at home. RA 95% No SOB noted.

## 2014-07-10 NOTE — H&P (Addendum)
NEUROLOGY RESIDENT PGY-3 VET ADMISSION H&P    PATIENT:  Peter Deleon  MRN:         [id number]    NOTE DATE / TIME:  07/10/2014  @ 15:24    CHIEF COMPLAINT:  Patient is directly admitted for video EEG telemetry monitoring.    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     Spell History:  Onset: 2618 months of age, 3 months after he suffered streptococcal pharyngitis.   Spell/Aura description(s):      1) Aura consists of stereotyped sensation of being "lost" or "abandoned."  This lasts for about 30 seconds. He also used to have bitter tastes in his mouth when he was younger, but this no longer occurs. Marland Kitchen. He then experiences an "out of body" experience, during which he feels like an "observer." He can feel and hear people, but simultaneously feels dissociated. During his most recent seizure, he started to feel a sensation for intense fear and anxiety with suffocation. Patient has been told by others that he has fidgety hand movements and grinds his teeth, although patient is not aware of this. Patient reports that he thinks he understands what others are saying, but he is slow to respond.     -Frequency: monthly. Prior to VNS placement, patient was having 3-4 seizures per month. VNS was placed by Dr. Helene ShoeBirk in [place] in September of 2012.     -Last occured: 3-4 weeks ago in the setting of missed dose 2 days prior to event.     2) Convulsion: only had one convulsion since he was an infant. It occurred 2 years ago after having back-to-back auras. He is amnestic of the event. His wife took a video which showed back and forth head movements. The video is  unfortunately not available for us to review. His convulsion lasted for abut 4-5 minutes, after which he was taken to a hospital.     Triggers: Stress, lack of sleep, illness, hot temperatures   Driving: patient has a valid driver's license but is not driving.    Seizure risk factors:        History of perinatal hypoxia - no       History of birth or early complications - strep pharyngitis       Developmental delay - no       History of CNS infection - no       History of febrile seizures - no      History of head trauma - no       Family history of seizures - no       History of abuse - no    Prior work up:   - Per prior clinic notes, patient had an OSH EEG 12/2009 which was reportedly normal but no records are available. EEG 11/2011 was abnormal showing left hemispheric sharp and slow activity with phase reversal. He has been offered L temporal epilepsy surgery in the past but decided against it. This was offered by Dr. Isaiah SergeBertram of [facility]. Patient was afraid of losing his memory and language because a relative had the same problem and  had complications afterwards.     Past AED history:  - Phenobarbital :discontinued due to intolerance (although patient does not know exactly why)  - Dilantin: unclear why this was discontinued   - Tegretol: ineffective at high doses   - Mysolin: ineffective, stopped while they were trying to simplify his regimen  - Lamictal: unclear why it was discontinued     Current AEDs (compliance):   He is taking the following medications with good compliance  -Depakote 1000 mg BID  - Keppra 500 mg BID (was previously on a higher dose, but decreased to this dose for unclear reason). Denies any mood related side effects.   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    Mother passed away of lung cancer. She was a smoker.   Maternal great grand mother had seizures, details unknown      SOCIAL HISTORY:   Tobacco: Former smoker. Intermittently smoked 1 PPD since he was teenager up until 15 years ago. Denies drinking. Denies illicit drug use   Occupation:  On disability. Used to work in the [employer] in the [employer]. 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 Visual fields full to confrontation. Fundi clear margins.  CN 3,4,6: EOM intact.  No nystagmus.  CN 5: Sensation to LT in V1-V3. Sensation to pinprick intact. Masseter strength 5/5.  CN 7: Facial symmetry intact. Eye closure strength 5/5.  CN 8: Hearing intact.  CN 9, 10: Palate elevation symmetric.  CN 11: SCM strength 5/5. Trapezius strength 5/5.  CN 12: Tongue protrusion midline.  Motor:  Tone and bulk: normal   Adventitious movements: none   Pronator drift: none   Strength: 5/5 throughout all  extremities    Reflexes:  2+ throughout all extremities,     Sensory:  Light touch: intact  Vibration: intact    Coordination:  F-to-N: intact  DIAGNOSTIC STUDIES:  No results found for this visit on 07/10/14 (from the past 24 hour(s)).    OSH EEG 12/2009 which was reportedly normal but no records are available. EEG 11/2011 was abnormal showing left hemispheric sharp and slow activity with phase reversal.    MRI:  MR CONSISTENT WITH PROBABLE CONGENITAL ARCHITECTURAL ARRAY DISTORTION OF  THE RIGHT HIPPOCAMPUS AND AMYGDALA. 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     It is conceivable that patient's epilepsy may be secondary to the structural abnormalities in the right side of his hippocampus and amygdala seen on MRI, however this would not explain the outside EEG report suggesting L hemispheric epileptogenic abnormalities. We unfortunately do not have any EEGs in our system to review.     Plan to admit patient to further localize and characterize his events.    PLAN:   -Admit to Neurology for video EEG telemetry monitoring  -Plan to decrease current AEDs:    - Depakote  BID ->500mg  BID   - Zonegran  BID ->  BID   - Continue current dose of Keppra  BID for now     -Labs: CBC, CMP, Mg, Phos, ammonia and AED levels    VET lab protocol:  -Seizure  precautions / padded bed rails.  -Door to room to always remain open, sitter must be present 24 hours a day.  -Continuous pulse oximetry with alarm set at 85% saturation or less.   -RN to notify team for generalized convulsive seizure lasting more than 2 minutes,        two or more convulsive seizures within a 60 minute period, or two or more seizures without complete recovery of consciousness beween seizures.  -Ativan  is to be given for generalized convulsive seizures lasting more than 2 minutes, two or more generalized convulsive seizures within any 8 hour period, or three or more seizures of any duration accompanied by alteration of consciousness in any 12 hour period.    F/E/N:  Saline lock, Regular diet. Replete Lytes PRN.  DVT/GI PROPHYLAXIS:  SQH. ALPs. Colace.  CODE STATUS:  Full  DISPOSITION:  Home      [Based on literature "the shortest reported average time for successful, clinically relevant long-term monitoring was 4.8 - 7.6 days" and "video EEG monitoring combined with sleep deprivation and protocolized rapid antiepileptic drug withdrawal is a safe & effective investigative technique with no adverse long-term sequela."  1) Ghougassian, D. F., et al. "Evaluating the Utility of Inpatient Video-EEG Monitoring." Epilepsia 45.8 (2004): 928-32. Web.   2) Rizvi, S. A.a., et al. "Is Rapid Withdrawal of Anti-epileptic Drug Therapy during Video EEG Monitoring Safe and Efficacious?" Epilepsy Research 108.4 (2014): 755-64. Web.   3) Velis, D., et al. "Recommendations Regarding the Requirements and Applications for Long-term Recordings in Epilepsy." Epilepsia  48.2 (2007): 379-84. Web.]      PRESENT ON ADMISSION:  Are any of the following five conditions present or suspected on admission: decubitus ulcer, infection from an intravascular device, infection due to an indwelling catheter, surgical site infection, or pneumonia? No.    Patient was seen and discussed with attending Epileptologist, Dr. Kyung Rudd.      Report Electronically Signed By:  Enis Gash, MD  Resident Physician, PGY3   [facility] [address] Department of Neurology   Pager: [phone]  Neurology Service Pager: [phone]  Pediatric Neurology Service Pager: [phone] (7am-6pm Mon-Fri, otherwise page [phone])       ATTENDING ADDENDUM  The patient was seen and examined.  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 Seizures are described as sense of an out of body experience followed by loss of awareness. Witnesses describe mouth and hand movements suggestive of automatisms. Previous investigation is not available, reportedly left temporal resection was offered. Description is suggestive of temporo-parietal symptomatogenic cortex. MRI was reviewed, revealing right hippocampus and mesial temporal sclerosis/atrophy, and interpreted to be likely congenital malformation. Given description and MRI finding, dual pathology is of consideration.    Report electronically signed by Tyler Pita, MD. Attending

## 2014-07-11 LAB — CULTURE SURVEILLANCE, MRSA

## 2014-07-11 MED ORDER — LEVOCARNITINE 330 MG TABLET
330.0000 mg | ORAL_TABLET | Freq: Two times a day (BID) | ORAL | Status: DC
Start: 2014-07-11 — End: 2014-07-15
  Administered 2014-07-11 – 2014-07-15 (×9): 330 mg via ORAL
  Filled 2014-07-11 (×9): qty 1

## 2014-07-11 MED ORDER — FLUTICASONE PROPIONATE 50 MCG/ACTUATION NASAL SPRAY,SUSPENSION
1.0000 | Freq: Once | NASAL | Status: AC
Start: 2014-07-11 — End: 2014-07-11
  Administered 2014-07-11: 1 via NASAL
  Filled 2014-07-11: qty 16

## 2014-07-11 NOTE — Nurse Assessment (Signed)
ASSESSMENT NOTE    Note Started: 07/11/2014, 19:28     Initial assessment completed and recorded in EMR.  Report received from day shift nurse and orders reviewed. Pt admitted for continuous video EEG and telemonitoring. Pt has history of bipolar disorder, neck and back pain s/p surgery, OSA on nocturnal CPAP, intractable epilepsy s/p VNS placement in September 2012. Pt AOx4, in RA with clear lungs, moves all extremities well, ambulatory to the bathroom with constant supervision, denies any numbness/tingling but does complain of headache- will administer tylenol. Seizure precautions maintained- padded side rails, oxygen and suction connected and functioning, lights on. Will continue to monitor. Plan of Care reviewed and updated, discussed with patient.  Jake Sharkaissa Nani Ingram, RN

## 2014-07-11 NOTE — Plan of Care (Signed)
Problem: Patient Care Overview (Adult)  Goal: Plan of Care Review  Outcome: Ongoing (interventions implemented as appropriate)  Goal Outcome Evaluation Note    Peter Deleon is a 54yr male admitted 07/10/2014      OUTCOME SUMMARY AND PLAN MOVING FORWARD:   Pt admitted for continuous video EEG and telemonitoring. Pt AOx4, in RA with clear lungs, ambulatory to the bathroom with constant supervision. Pt denies any numbness/tingling in extremities, complained of pain in lower back and headache-administered tylenol. Pt educated on seizure protocol and to call RN for assistance when getting out of bed to the bathroom. Pt still on AEDs- Keppra and Depakote. Seizure precautions maintained- padded side rails, oxygen and suction connected and functioning, lights left on. Will continue to monitor.      Goal: Individualization and Mutuality  Outcome: Ongoing (interventions implemented as appropriate)  Goal: Discharge Needs Assessment  Outcome: Ongoing (interventions implemented as appropriate)    Problem: Sleep Pattern Disturbance (Adult)  Goal: Identify Related Risk Factors and Signs and Symptoms  Related risk factors and signs and symptoms are identified upon initiation of Human Response Clinical Practice Guideline (CPG)   Outcome: Outcome(s) achieved Date Met:  07/11/14  Goal: Adequate Sleep/Rest  Patient will demonstrate the desired outcomes by discharge/transition of care.   Outcome: Ongoing (interventions implemented as appropriate)    Problem: Fall Risk (Adult)  Goal: Identify Related Risk Factors and Signs and Symptoms  Related risk factors and signs and symptoms are identified upon initiation of Human Response Clinical Practice Guideline (CPG)   Outcome: Outcome(s) achieved Date Met:  07/11/14  Goal: Absence of Falls  Patient will demonstrate the desired outcomes by discharge/transition of care.   Outcome: Ongoing (interventions implemented as appropriate)    Problem: Pain, Chronic (Adult)  Goal: Identify Related Risk  Factors and Signs and Symptoms  Related risk factors and signs and symptoms are identified upon initiation of Human Response Clinical Practice Guideline (CPG)   Outcome: Outcome(s) achieved Date Met:  07/11/14  Goal: Acceptable Pain Control/Comfort Level  Patient will demonstrate the desired outcomes by discharge/transition of care.   Outcome: Ongoing (interventions implemented as appropriate)    Problem: Seizure Disorder/Epilepsy (Adult)  Goal: Signs and Symptoms of Listed Potential Problems Will be Absent or Manageable (Seizure Disorder/Epilepsy)  Signs and symptoms of listed potential problems will be absent or manageable by discharge/transition of care (reference Seizure Disorder/Epilepsy (Adult) CPG).   Outcome: Ongoing (interventions implemented as appropriate)

## 2014-07-11 NOTE — Progress Notes (Addendum)
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     24 HOUR EVENTS:   -Spells: no  -EEG correlation: N/A  -Medication changes: Depakote and Zonegran reduced to 50%.   -EEG to continue for the next 24 hrs: yes  -VPA level returned at 40. Ammonia level returned elevated at 91.     S: Patient reports no new complaints. He has not had any of his events so far.     O:  CURRENT MEDICATIONS  Scheduled:Aripiprazole (ABILIFY) Tablet 2 mg, ORAL, QAM  Calcium-Cholecalciferol (D3) (OSCAL 500+D) Tablet 1 tablet, ORAL, BID w/ meals  Divalproex (DEPAKOTE) Delayed Release Tablet 500 mg, ORAL, BID  Docusate (COLACE) Capsule 100 mg, ORAL, BID  Fluticasone (FLONASE) Nasal Spray 1 spray, EACH Nostril, ONCE  Fluvoxamine (LUVOX) Tablet 50 mg, ORAL, Daily Bedtime  Heparin PF 5,000 units/0.5 mL Injection 5,000 Units, SUBCUTANEOUS, Q8H (45,40,98)  LevETIRAcetam (KEPPRA) Tablet 500 mg, ORAL, BID  Levocarnitine (CARNITOR) Tablet 330 mg, ORAL, BID  LevoTHYROxine (SYNTHROID) Tablet 75 mcg, ORAL, QAM AC  Multivitamin (HEXAVITAMIN) Tablet 1 tablet, ORAL, QAM  Pantoprazole (PROTONIX) Delayed Release Tablet 20 mg, ORAL, QAM AC  Tamsulosin (FLOMAX) Capsule 0.4 mg, ORAL, Daily Bedtime  Venlafaxine (EFFEXOR) Tablet 150 mg, ORAL, QAM w/ meal  Zonisamide (ZONEGRAN) Capsule 100 mg, ORAL, BID    IV Fluids & Drips:   [id number] (TYLENOL) Tablet 650 mg, ORAL, Q4H PRN  Lorazepam (ATIVAN) Injection 1 mg, IV, PRN  Naproxen (NAPROSYN) Tablet 500 mg,  ORAL, Q8H PRN       VITALS:     Current  Minimum Maximum   BP BP: 105/66 mmHg  BP: (105-141)/(66-102)    Temp Temp: 36.5 C (97.7 F)  Temp Min: 36.3 C (97.3 F)  Temp Max: 36.6 C (97.9 F)    Pulse Pulse: 70 Pulse Min: 70  Pulse Max: 108    Resp Resp: 16 Resp Min: 14  Resp Max: 16    O2 Sat SpO2: 96 % SpO2 Min: 93 % SpO2 Max: 96 %   O2 Deliv None ; No Data Recorded      SpO2: 96 %  Pulse: 70    24 HOUR INTAKE/OUTPUT:  I/O Last 2 Completed Shifts:  In: 440 [Oral:440]  Out: -     EXAM  General:  NAD, lying in bed, EEG leads in place   Mental status:  Alert, appropriately answers questions. Speech clear and appropriate   Cranial nerves:  PERRL 4->59mm. EOM intact. Facial sensation and strength intact and symmetric.   Motor:  Deltoids, biceps, triceps, grip, hip flexors, knee flexors/extensors, plantarflexion/dorsiflexion 5/5 in BUE and BLE   Sensory:  Intact to light touch throughout     DIAGNOSTIC STUDIES  POC Glucose, blood: --  Lab Results -  24 hours (excluding micro and POC)   VALPROATE     Status: Abnormal   Result Value Status    VALPROATE 40 (L) Final   BASIC METABOLIC PANEL     Status: Abnormal   Result Value Status    SODIUM 139 Final    POTASSIUM 4.2 Final    CHLORIDE 105 Final    CARBON DIOXIDE TOTAL 23 (L) Final    UREA NITROGEN, BLOOD (BUN) 16 Final    CREATININE BLOOD 0.97 Final    E-GFR, AFRICAN AMERICAN >60 Final    E-GFR, NON-AFRICAN AMERICAN >60 Final    GLUCOSE 117 (H) Final    CALCIUM 9.4 Final   MAGNESIUM (MG)     Status: None   Result Value Status    MAGNESIUM (MG) 2.1 Final   PHOSPHORUS (PO4)     Status: None   Result Value Status    PHOSPHORUS (PO4) 3.4 Final   CBC WITH DIFFERENTIAL     Status: None   Result Value Status    WHITE BLOOD CELL COUNT 6.8 Final    RED CELL COUNT 5.00 Final    HEMOGLOBIN 14.9 Final    HEMATOCRIT 43.8 Final    MCV 87.7 Final    MCH 29.9 Final    MCHC 34.1 Final    RDW 13.7 Final    MPV 9.3 Final    PLATELET COUNT 175 Final    NEUTROPHILS % AUTO 55.6 Final     LYMPHOCYTES % AUTO 31.0 Final    MONOCYTES % AUTO 8.4 Final    EOSINOPHIL % AUTO 4.7 Final    BASOPHILS % AUTO 0.3 Final    NEUTROPHIL ABS AUTO 3.80 Final    LYMPHOCYTE ABS AUTO 2.1 Final    MONOCYTES ABS AUTO 0.6 Final    EOSINOPHIL ABS AUTO 0.3 Final    BASOPHILS ABS AUTO 0 Final   HEPATIC FUNCTION PANEL     Status: Abnormal   Result Value Status    PROTEIN 6.2 (L) Final    ALBUMIN 3.8 Final    ALKALINE PHOSPHATASE (ALP) 67 Final    ASPARTATE TRANSAMINASE (AST) 34 Final    BILIRUBIN TOTAL 0.8 Final    ALANINE TRANSFERASE (ALT) 41 Final    BILIRUBIN DIRECT 0.2 Final   AMMONIA     Status: Abnormal   Result Value Status    AMMONIA 91 (H) Final     VNS Programming/Interrogation, 07/11/2014  Battery: Full  Output current: 1.00 mA-->0.6500mA  Signal frequency: 30 Hz  Pulse width: 130 uSec  Signal On time: 30 sec   Signal Off time: 5.0 min   Magnet triggered current: 0.75 mA -->0.8200mA  Magnet on time: 30 sec  Magnet pulse width: 250 uSec      SUMMARY & IMPRESSION:  Peter Deleon is a [age]yr-old right-handed male with a past medical history of bipolar disorder, neck and back pain s/p surgery, OSA on nocturnal CPAP, 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     PLAN:   -Continue video EEG telemetry monitoring  -Adjust AED Regimen as below:   Hospital day 1     - Depakote 1000mg  BID ->500mg  BID     - Zonegran 200mg  BID -> 100mg  BID   - Continue current dose of Keppra 500mg  BID for now    Hospital day 2 (07/11/14)    - Continue Keppra 500mg  BID    -  Discontinue Depakote and Zonegran     - Start Carnitor 1g/day for elevated ammonia   - VNS turned off today  - F/u AED levels. Will repeat ammonia level in a couple of days     VET lab protocol:  -Seizure precautions / padded bed rails.  -Door to room to always remain open, sitter must be present 24 hours a  day.  -Continuous pulse oximetry with alarm set at 85% saturation or less.   -RN to notify team for generalized convulsive seizure lasting more than 2 minutes,   two or more convulsive seizures within a 60 minute period, or two or more seizures without complete recovery of consciousness beween seizures.  -Ativan  is to be given for generalized convulsive seizures lasting more than 2 minutes, two or more generalized convulsive seizures within any 8 hour period, or three or more seizures of any duration accompanied by alteration of consciousness in any 12 hour period.    F/E/N: Saline lock, Regular diet. Replete Lytes PRN.  DVT/GI PROPHYLAXIS: SQH. ALPs. Colace.  CODE STATUS: Full  DISPOSITION: Home     [Based on literature "the shortest reported average time for successful, clinically relevant long-term monitoring was 4.8 - 7.6 days" and "video EEG monitoring combined with sleep deprivation and protocolized rapid antiepileptic drug withdrawal is a safe & effective investigative technique with no adverse long-term sequela."  1) Ghougassian, D. F., et al. "Evaluating the Utility of Inpatient Video-EEG Monitoring." Epilepsia 45.8 (2004): 928-32. Web.   2) Rizvi, S. A.a., et al. "Is Rapid Withdrawal of Anti-epileptic Drug Therapy during Video EEG Monitoring Safe and Efficacious?" Epilepsy Research 108.4 (2014): 755-64. Web.   3) Velis, D., et al. "Recommendations Regarding the Requirements and Applications for Long-term Recordings in Epilepsy." Epilepsia 48.2 (2007): 379-84. Web.]      PRESENT ON ADMISSION:  Are any of the following five conditions present or suspected on admission: decubitus ulcer, infection from an intravascular device, infection due to an indwelling catheter, surgical site infection, or pneumonia? No.    Patient was seen and discussed with attending Epileptologist, Dr. Kyung Rudd.     Report Electronically Signed By:  Enis Gash, MD  Resident Physician, PGY3   [facility] Department of  Neurology   Pager: [phone]  Neurology Service Pager: [phone]    The patient was seen and examined.  I reviewed and agree with the resident's assessment and plan we developed as outlined in the resident's note.  Report electronically signed by Argentina Ponder, MD. Attending

## 2014-07-11 NOTE — Nurse Focus (Signed)
Patient pushed event button, states "I had an aura" when asked to describe states "It's hard to describe it..its just an aura".  Mardee Postinynthia Estefany Goebel, RN

## 2014-07-12 LAB — ZONISAMIDE: ZONISAMIDE: 14 ug/mL

## 2014-07-12 LAB — ELECTROCARDIOGRAM WITH RHYTHM STRIP: QTC: 437

## 2014-07-12 LAB — KEPPRA (LEVETIRACETAM)

## 2014-07-12 LAB — AMMONIA: AMMONIA: 70 umol/L — AB (ref 2–30)

## 2014-07-12 MED ORDER — LEVETIRACETAM 250 MG TABLET
250.0000 mg | ORAL_TABLET | Freq: Two times a day (BID) | ORAL | Status: DC
Start: 2014-07-12 — End: 2014-07-12

## 2014-07-12 NOTE — Progress Notes (Addendum)
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     24 HOUR EVENTS:   -Spells: no, but reports an "aura" of being confused for a few seconds (typical aura consists of this along with abnormal taste sensation)  -EEG correlation: N/A  -Medication changes: Depakote and Zonegran discontinued.   -EEG to continue for the next 24 hrs: yes  -repeat ammonia level this morning 70  -patient reported headache overnight for which he was given tylenol with improvement    S: Patient reports no new complaints. He has not had any of his events so far.     O:  CURRENT MEDICATIONS  Scheduled:Aripiprazole (ABILIFY) Tablet 2 mg, ORAL, QAM  Calcium-Cholecalciferol (D3) (OSCAL 500+D) Tablet 1 tablet, ORAL, BID w/ meals  Docusate (COLACE) Capsule 100 mg, ORAL, BID  Fluvoxamine (LUVOX) Tablet 50 mg, ORAL, Daily Bedtime  Heparin PF 5,000 units/0.5 mL Injection 5,000 Units, SUBCUTANEOUS, Q8H (81,19,14(06,14,22)  LevETIRAcetam (KEPPRA) Tablet 500 mg, ORAL, BID  Levocarnitine (CARNITOR) Tablet 330 mg, ORAL, BID  LevoTHYROxine (SYNTHROID) Tablet 75 mcg, ORAL, QAM AC  Multivitamin (HEXAVITAMIN) Tablet 1 tablet, ORAL, QAM  Pantoprazole (PROTONIX) Delayed Release Tablet 20 mg, ORAL, QAM AC  Tamsulosin (FLOMAX) Capsule 0.4 mg, ORAL, Daily Bedtime  Venlafaxine (EFFEXOR) Tablet 150 mg, ORAL, QAM w/ meal    IV Fluids & Drips:   NWG:[id number]:Acetaminophen (TYLENOL) Tablet 650 mg, ORAL, Q4H PRN  Lorazepam (ATIVAN) Injection 1 mg, IV, PRN  Naproxen (NAPROSYN) Tablet 500  mg, ORAL, Q8H PRN       VITALS:     Current  Minimum Maximum   BP BP: 124/77 mmHg  BP: (121-124)/(77-93)    Temp Temp: 36.8 C (98.2 F)  Temp Min: 36.4 C (97.5 F)  Temp Max: 36.8 C (98.2 F)    Pulse Pulse: 115 Pulse Min: 91  Pulse Max: 115    Resp Resp: 16 Resp Min: 14  Resp Max: 16    O2 Sat SpO2: 95 % SpO2 Min: 93 % SpO2 Max: 98 %   O2 Deliv None ; No Data Recorded      SpO2: 95 %  Pulse: 115    24 HOUR INTAKE/OUTPUT:  I/O Last 2 Completed Shifts:  In: 900 [Oral:900]  Out: -     EXAM  General:  NAD, lying in bed, EEG leads in place   Mental status:  Alert, appropriately answers questions. Speech clear and appropriate   Cranial nerves:  PERRL 4->453mm. EOM intact. Facial sensation and strength intact and symmetric.   Motor:  Deltoids, biceps, triceps, grip, hip flexors, knee flexors/extensors, plantarflexion/dorsiflexion 5/5 in BUE and BLE   Sensory:  Intact to light touch throughout     DIAGNOSTIC STUDIES  POC Glucose, blood: --  Lab Results - 24  hours (excluding micro and POC)   AMMONIA     Status: Abnormal   Result Value Status    AMMONIA 70 (H) Final     VNS Programming/Interrogation, 07/11/2014  Battery: Full  Output current: 1.00 mA-->0.68mA  Signal frequency: 30 Hz  Pulse width: 130 uSec  Signal On time: 30 sec   Signal Off time: 5.0 min   Magnet triggered current: 0.75 mA -->0.27mA  Magnet on time: 30 sec  Magnet pulse width: 250 uSec      SUMMARY & IMPRESSION:  Peter Deleon is a 54yr-old right-handed male with a past medical history of bipolar disorder, neck and back pain s/p surgery, OSA on nocturnal CPAP, 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     PLAN:   -Continue video EEG telemetry monitoring  -Adjust AED Regimen as below:   Hospital day 1     - Depakote  BID ->500mg  BID     - Zonegran  BID ->  BID   - Continue  current dose of Keppra  BID for now    Hospital day 2 (07/11/14)    - Continue Keppra  BID    - Discontinue Depakote and Zonegran    Hospital day 3 (07/12/14)    - Decrease Keppra to 250 mg BID  - Continue Carnitor 1g/day for elevated ammonia   - VNS turned off 5/24  - F/u AED levels. Will repeat ammonia level in a couple of days     VET lab protocol:  -Seizure precautions / padded bed rails.  -Door to room to always remain open, sitter must be present 24 hours a day.  -Continuous pulse oximetry with alarm set at 85% saturation or less.   -RN to notify team for generalized convulsive seizure lasting more than 2 minutes,   two or more convulsive seizures within a 60 minute period, or two or more seizures without complete recovery of consciousness beween seizures.  -Ativan  is to be given for generalized convulsive seizures lasting more than 2 minutes, two or more generalized convulsive seizures within any 8 hour period, or three or more seizures of any duration accompanied by alteration of consciousness in any 12 hour period.    F/E/N: Saline lock, Regular diet. Replete Lytes PRN.  DVT/GI PROPHYLAXIS: SQH. ALPs. Colace.  CODE STATUS: Full  DISPOSITION: Home     [Based on literature "the shortest reported average time for successful, clinically relevant long-term monitoring was 4.8 - 7.6 days" and "video EEG monitoring combined with sleep deprivation and protocolized rapid antiepileptic drug withdrawal is a safe & effective investigative technique with no adverse long-term sequela."  1) Ghougassian, D. F., et al. "Evaluating the Utility of Inpatient Video-EEG Monitoring." Epilepsia 45.8 (2004): 928-32. Web.   2) Rizvi, S. A.a., et al. "Is Rapid Withdrawal of Anti-epileptic Drug Therapy during Video EEG Monitoring Safe and Efficacious?" Epilepsy Research 108.4 (2014): 755-64. Web.   3) Velis, D., et al. "Recommendations Regarding the Requirements and Applications for Long-term Recordings in  Epilepsy." Epilepsia 48.2 (2007): 379-84. Web.]      PRESENT ON ADMISSION:  Are any of the following five conditions present or suspected on admission: decubitus ulcer, infection from an intravascular device, infection due to an indwelling catheter, surgical site infection, or pneumonia? No.    Patient will be discussed with attending Epileptologist, Dr. Kyung Rudd.     Report Electronically Signed By:  Alphonzo Cruise, MD  [facility] Neurology PGY-3  Pager: [phone]  After 6 PM and on weekends please call Consult Pager: [phone]    The patient was seen and examined.  I reviewed and agree with the resident's assessment and plan we developed as outlined in the resident's note.  Report electronically signed by Argentina Ponder, MD. Attending

## 2014-07-12 NOTE — Plan of Care (Signed)
Problem: Patient Care Overview (Adult)  Goal: Plan of Care Review  Outcome: Ongoing (interventions implemented as appropriate)  Goal Outcome Evaluation Note    Peter Deleon is a [age]yr male admitted 07/10/2014      OUTCOME SUMMARY AND PLAN MOVING FORWARD:   Pt admitted for continuous video EEG and telemonitoring. Pt AOx4, in RA with clear lungs, ambulatory to the bathroom with constant supervision. Pt complained of headache- administered tylenol which was effective. Pt denies any numbness/tingling in extremities. Pt off AEDs except still on Keppra. Seizure precautions maintained. No sitter present at bedside. Pt slept throughout the night. Will continue to monitor.      Goal: Discharge Needs Assessment  Outcome: Ongoing (interventions implemented as appropriate)    Problem: Sleep Pattern Disturbance (Adult)  Goal: Adequate Sleep/Rest  Patient will demonstrate the desired outcomes by discharge/transition of care.   Outcome: Ongoing (interventions implemented as appropriate)    Problem: Fall Risk (Adult)  Goal: Absence of Falls  Patient will demonstrate the desired outcomes by discharge/transition of care.   Outcome: Ongoing (interventions implemented as appropriate)    Problem: Pain, Chronic (Adult)  Goal: Acceptable Pain Control/Comfort Level  Patient will demonstrate the desired outcomes by discharge/transition of care.   Outcome: Ongoing (interventions implemented as appropriate)    Problem: Seizure Disorder/Epilepsy (Adult)  Goal: Signs and Symptoms of Listed Potential Problems Will be Absent or Manageable (Seizure Disorder/Epilepsy)  Signs and symptoms of listed potential problems will be absent or manageable by discharge/transition of care (reference Seizure Disorder/Epilepsy (Adult) CPG).   Outcome: Ongoing (interventions implemented as appropriate)

## 2014-07-12 NOTE — Allied Health Progress (Signed)
Case Management Progress Note:    Discharge Planning screening completed. No discharge needs identified, will re-evaluate if alerted to change in condition or if referral is received.    Funding: Medicare part A/B and Partnership  Patient can follow-up with PCP.        Electronically Signed by:  Betsey AmenLoreto Jaidyn Kuhl RN,BSN  Case Manager  [phone] (direct line )  [phone]

## 2014-07-12 NOTE — Nurse Assessment (Signed)
Received patient in bed awake, eating breakfast, in a happy mood this AM despite complaints that roommates alarm kept him awake most of the night. Admitted for 24 Hour Video EEG monitoring, all leads intact. VSS. Seizure precaution reinforced, patient verbalizes understanding. Off most of his AED at this point. Will closely monitor.  Mardee Postinynthia Davyn Morandi, RN

## 2014-07-12 NOTE — Progress Notes (Signed)
NEUROLOGY PGY-3 PROGRESS NOTE UPDATE 5/25/201 @ 21:49    After discussion with Dr. Kennedy, agreed to completely discontinue patient's AED's, including cessation of his keppra. This was done starting with tonight's doses of the medication. Will monitor for any seizures, with the goal of capturing 4-5 seizures prior to resumption of medications.    Peter Faulcon, MD  [facility] PGY-3  Pager: [phone]  After 6 PM and on weekends please call Consult Pager: [phone]

## 2014-07-12 NOTE — Discharge Planning (AHS/AVS) (Signed)
Here are some community resources for long term care should you need them in the future:    [employer] (AAA)          [phone] [phone]          www.[place].gov    Medicare                                                                          www.medicare.gov  (800) 633-4227    Medi-Cal  [phone] 874-3100 For [place] county only   For all other counties, refer to website  www.[place].gov/services/medi-cal    [facility] ([facility])    ([phone] 474-1116    www.canhr.org    If you have any further questions related to this matter, you can contact Clinical Case Management at [phone].

## 2014-07-13 NOTE — Nurse Assessment (Signed)
Patient was admitted for seizure monitoring, he has no sitter at the bedside, he drove with the bus from [place] where he lives. Leads intact, ambulating with assist to the bathroom.    Leo Grosserosa T Chinmay Squier RN

## 2014-07-13 NOTE — Nurse Focus (Signed)
FOCUS NOTE Button push at 03:05    Note began 5/26/201603:17    Reported that he heard his roommate make a sound, was not sure if he saw patient in 5135-1 legs moving, "I recognized that sound so I pushed the button... I knew that you guys will be here quick."     Kerron Sedano, RN

## 2014-07-13 NOTE — Nurse Focus (Signed)
Son from the Patient in the next bed, came to say that the Patient was looking spaced out,I entered the room, Patient appears asleep, when asked him if he was ok, he nodded yes, aware of the name of location and RN name, asked him if he had a seizure, he states "no", informed Dr Maryfrances BunnellAnsari, he was able to view the time and patient did have a seizure around 1611, so far 2 seizures today.    Leo Grosserosa T Leanore Biggers RN

## 2014-07-13 NOTE — Nurse Focus (Signed)
BRIEF CONVULSIVE SEIZURE CHECKLIST NOTE    At time 0955, the patient had a seizure. It lasted a total of few minutes minutes.    1. Vitals   O2 saturation: 96   Heart rate: 164  2. The following immediate interventions were given as needed:   Supplemental oxygen:  no   Mouth suction:  no   Medications to stop seizure:  no  3. The following standard precautions were taken:   Turned patient on their side:  no   Padded bedrails up:  yes   Heavy objects removed from near patient head:  yes  4. The following observations about the patient's seizure were noted:   Eyes open during seizure:  yes    Body parts affected: hands and eyes, he was switching the channels on the TV, looking around, touching his cords around him, ones or twice lip smacking   Description of behaviors: looking for things, able to put his sock back on.   Responsive to verbal stimuli: no   Movements rhythmic:  yes   Automatisms:   yes   Tongue biting:  no   Urinary or fecal incontinence:  no   Post-ictal behavior: sleeping   Return to baseline:  yes  After 2-3  minutes  5. Radiology has been called for a stat portable chest xray:  no  6. The physician was contacted:  No  Patient had pushed the button before, he felt anxious, scared and a metallic taste, stating that he might get a seizure after.After a few minutes patient stating " this is it ", during the seizure asked him to remember red roses, Patient was unable to do so.    Electronically signed on 07/13/2014 at 10:06 by  Leo Grosserosa T Haruko Mersch RN

## 2014-07-13 NOTE — Nurse Assessment (Signed)
ASSESSMENT    Note started: 5/26/201602:47    Assumed care at 1915 after received report from day shift RN Aram Beechamynthia. Patient alert and oriented. Admitted for 24-hr video EEG telemetry monitoring to characterize his seizures; no longer on AEDs this shift; goal is to capture at least 5 events. Has no family at bedside. Rates headache pain 5/10, which he attributes to lack of caffeine; declined pain meds and opted for coffee; had 2 cups which helped with headache, "all gone." Orders reviewed, initial assessment completed and recorded in EMR. Plan of Care reviewed and appropriate, discussed with patient. Care plan reviewed and appropriate.     Jamill Wetmore, RN

## 2014-07-13 NOTE — Nurse Focus (Signed)
FOCUS NOTE Event     Note began 5/26/201605:40    Peter Deleon was pushed at 05:23 by patient who reported having a feeling of " tasting something." On further questioning, states that the taste was bitter...maybe even a metallic taste."     Peter Tallman, RN

## 2014-07-13 NOTE — Plan of Care (Signed)
Problem: Patient Care Overview (Adult)  Goal: Plan of Care Review  Outcome: Ongoing (interventions implemented as appropriate)  Goal Outcome Evaluation Note    Peter JollyJames Deleon is a [age]yr male admitted 07/10/2014      OUTCOME SUMMARY AND PLAN MOVING FORWARD:   On Day 4 of 24-hr video EEG monitoring to characterize his seizures. Last button push was for reported aura on 07/11/14 at 10:48. Off AEDs since Wednesday pm. Goal is to capture 4-5 seizures. Still awake as of 02:59 a.m.  Goal: Discharge Needs Assessment  Outcome: Ongoing (interventions implemented as appropriate)    Problem: Sleep Pattern Disturbance (Adult)  Goal: Adequate Sleep/Rest  Patient will demonstrate the desired outcomes by discharge/transition of care.   Outcome: Ongoing (interventions implemented as appropriate)  Has been staying up since beginning of this shift.     Problem: Fall Risk (Adult)  Goal: Absence of Falls  Patient will demonstrate the desired outcomes by discharge/transition of care.   Outcome: Ongoing (interventions implemented as appropriate)  Reiterated safety, fall and seizure precautions. Calls for bathroom assistance.     Problem: Pain, Chronic (Adult)  Goal: Acceptable Pain Control/Comfort Level  Patient will demonstrate the desired outcomes by discharge/transition of care.   Outcome: Ongoing (interventions implemented as appropriate)  Declined pain medications. States occasionally takes Excedrin for his headaches; "Tylenol doesn't really help at all." Opted for offer of coffee, drank 2 cups which he reports was effective "headache's gone."    Problem: Seizure Disorder/Epilepsy (Adult)  Goal: Signs and Symptoms of Listed Potential Problems Will be Absent or Manageable (Seizure Disorder/Epilepsy)  Signs and symptoms of listed potential problems will be absent or manageable by discharge/transition of care (reference Seizure Disorder/Epilepsy (Adult) CPG).   Outcome: Ongoing (interventions implemented as appropriate)  Off AEDs this shift.  Goal is to capture 4-5 events.

## 2014-07-13 NOTE — Progress Notes (Addendum)
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     24 HOUR EVENTS:   -Spells: yes, 1 aura (metallic taste, confusion), and then 1 seizure at 0955  -EEG correlation: yes, right temporal onset  -Medication changes: Keppra discontinued.   -EEG to continue for the next 24 hrs: yes    S: Patient reports continued brief auras with slight confusion, but immediately prior to his seizure he had a longer aura and metallic taste in his mouth. At this time, patient reports no complaints.    O:  CURRENT MEDICATIONS  Scheduled:Aripiprazole (ABILIFY) Tablet 2 mg, ORAL, QAM  Calcium-Cholecalciferol (D3) (OSCAL 500+D) Tablet 1 tablet, ORAL, BID w/ meals  Docusate (COLACE) Capsule 100 mg, ORAL, BID  Fluvoxamine (LUVOX) Tablet 50 mg, ORAL, Daily Bedtime  Heparin PF 5,000 units/0.5 mL Injection 5,000 Units, SUBCUTANEOUS, Q8H (45,40,98)  Levocarnitine (CARNITOR) Tablet 330 mg, ORAL, BID  LevoTHYROxine (SYNTHROID) Tablet 75 mcg, ORAL, QAM AC  Multivitamin (HEXAVITAMIN) Tablet 1 tablet, ORAL, QAM  Pantoprazole (PROTONIX) Delayed Release Tablet 20 mg, ORAL, QAM AC  Tamsulosin (FLOMAX) Capsule 0.4 mg, ORAL, Daily Bedtime  Venlafaxine (EFFEXOR) Tablet 150 mg, ORAL, QAM w/ meal    IV Fluids & Drips:   JXB:[id number] (TYLENOL) Tablet 650 mg, ORAL, Q4H PRN  Lorazepam (ATIVAN) Injection 1 mg, IV, PRN  Naproxen (NAPROSYN) Tablet 500 mg, ORAL, Q8H PRN       VITALS:     Current  Minimum Maximum   BP BP: 128/85 mmHg  BP: (128-141)/(85-99)     Temp Temp: 37 C (98.6 F)  Temp Min: 36.6 C (97.9 F)  Temp Max: 37 C (98.6 F)    Pulse Pulse: 95 Pulse Min: 95  Pulse Max: 96    Resp Resp: 16 Resp Min: 16  Resp Max: 16    O2 Sat SpO2: 95 % SpO2 Min: 95 % SpO2 Max: 95 %   O2 Deliv None ; No Data Recorded      SpO2: 95 %  Pulse: 95    24 HOUR INTAKE/OUTPUT:  I/O Last 2 Completed Shifts:  In: 1550 [Oral:1550]  Out: -     EXAM  General:  NAD, lying in bed, EEG leads in place   Mental status:  Alert, appropriately answers questions. Speech clear and appropriate   Cranial nerves:  PERRL 4->17mm. EOM intact. Facial sensation and strength intact and symmetric.   Motor:  Deltoids, biceps, triceps, grip, hip flexors, knee flexors/extensors, plantarflexion/dorsiflexion 5/5 in BUE and BLE   Sensory:  Intact to light touch throughout     DIAGNOSTIC STUDIES  POC Glucose, blood: --  No results found for this visit on 07/10/14 (from the past 24 hour(s)).  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     PLAN:   -Continue video EEG telemetry monitoring  -Adjust AED Regimen as below:   Hospital day 1     - Depakote  BID ->500mg  BID     - Zonegran  BID ->  BID   - Continue current dose of Keppra  BID for now    Hospital day 2 (07/11/14)    - Continue Keppra  BID    - Discontinue Depakote and Zonegran    Hospital day 3  (07/12/14)    - Decrease Keppra to 250 mg BID (was discontinued)  - Continue Carnitor 1g/day for elevated ammonia   - VNS turned off 5/24  - F/u AED levels. Will repeat ammonia level in a couple of days     VET lab protocol:  -Seizure precautions / padded bed rails.  -Door to room to always remain open, sitter must be present 24 hours a day.  -Continuous pulse oximetry with alarm set at 85% saturation or less.   -RN to notify team for generalized convulsive seizure lasting more than 2 minutes,   two or more convulsive seizures within a 60 minute period, or two or more seizures without complete recovery of consciousness beween seizures.  -Ativan  is to be given for generalized convulsive seizures lasting more than 2 minutes, two or more generalized convulsive seizures within any 8 hour period, or three or more seizures of any duration accompanied by alteration of consciousness in any 12 hour period.    F/E/N: Saline lock, Regular diet. Replete Lytes PRN.  DVT/GI PROPHYLAXIS: SQH. ALPs. Colace.  CODE STATUS: Full  DISPOSITION: Home     [Based on literature "the shortest reported average time for successful, clinically relevant long-term monitoring was 4.8 - 7.6 days" and "video EEG monitoring combined with sleep deprivation and protocolized rapid antiepileptic drug withdrawal is a safe & effective investigative technique with no adverse long-term sequela."  1) Ghougassian, D. F., et al. "Evaluating the Utility of Inpatient Video-EEG Monitoring." Epilepsia 45.8 (2004): 928-32. Web.   2) Rizvi, S. A.a., et al. "Is Rapid Withdrawal of Anti-epileptic Drug Therapy during Video EEG Monitoring Safe and Efficacious?" Epilepsy Research 108.4 (2014): 755-64. Web.   3) Velis, D., et al. "Recommendations Regarding the Requirements and Applications for Long-term Recordings in Epilepsy." Epilepsia 48.2 (2007): 379-84. Web.]      PRESENT ON ADMISSION:  Are any of the following five conditions present or suspected on  admission: decubitus ulcer, infection from an intravascular device, infection due to an indwelling catheter, surgical site infection, or pneumonia? No.    Patient will be discussed with attending Epileptologist, Dr. Kyung Rudd.     Report Electronically Signed By:  Alphonzo Cruise, MD  [facility] Neurology PGY-3  Pager: [phone]  After 6 PM and on weekends please call Consult Pager: [phone]    ATTENDING ADDENDUM  The patient was seen and examined.  I reviewed and agree  with the resident's assessment and plan we developed as outlined in the resident's note with the exception of any addendum noted below:  2-3 auras of a bad taste have been captured and one right hemispheric seizure this morning. Will continue to monitor.     Report electronically signed by Tyler PitaJeffrey Karson Chicas, MD. Attending

## 2014-07-14 DIAGNOSIS — F319 Bipolar disorder, unspecified: Secondary | ICD-10-CM

## 2014-07-14 MED ORDER — LEVOCARNITINE 330 MG TABLET
330.0000 mg | ORAL_TABLET | Freq: Two times a day (BID) | ORAL | Status: DC
Start: 2014-07-14 — End: 2014-09-05

## 2014-07-14 MED ORDER — LEVETIRACETAM 500 MG TABLET
500.0000 mg | ORAL_TABLET | Freq: Two times a day (BID) | ORAL | Status: DC
Start: 2014-07-14 — End: 2014-07-14

## 2014-07-14 MED ORDER — DIVALPROEX 250 MG TABLET,DELAYED RELEASE
750.0000 mg | DELAYED_RELEASE_TABLET | Freq: Two times a day (BID) | ORAL | Status: DC
Start: 2014-07-14 — End: 2015-01-29

## 2014-07-14 MED ORDER — ZONISAMIDE 100 MG CAPSULE
200.0000 mg | ORAL_CAPSULE | Freq: Two times a day (BID) | ORAL | Status: DC
Start: 2014-07-14 — End: 2014-07-15
  Administered 2014-07-14 – 2014-07-15 (×2): 200 mg via ORAL
  Filled 2014-07-14 (×2): qty 2

## 2014-07-14 MED ORDER — DIVALPROEX 250 MG TABLET,DELAYED RELEASE
1000.0000 mg | DELAYED_RELEASE_TABLET | Freq: Two times a day (BID) | ORAL | Status: DC
Start: 2014-07-14 — End: 2014-07-14

## 2014-07-14 MED ORDER — LEVETIRACETAM 250 MG TABLET
750.0000 mg | ORAL_TABLET | Freq: Two times a day (BID) | ORAL | Status: DC
Start: 2014-07-14 — End: 2015-03-29

## 2014-07-14 MED ORDER — DIVALPROEX 250 MG TABLET,DELAYED RELEASE
750.0000 mg | DELAYED_RELEASE_TABLET | Freq: Two times a day (BID) | ORAL | Status: DC
Start: 2014-07-14 — End: 2014-07-15
  Administered 2014-07-14 – 2014-07-15 (×2): 750 mg via ORAL
  Filled 2014-07-14 (×2): qty 3

## 2014-07-14 MED ORDER — LEVETIRACETAM 1,000 MG/100 ML IN SODIUM CHLORIDE(ISO-OSM) IV PIGGYBACK
1000.0000 mg | INJECTION | Freq: Once | INTRAVENOUS | Status: AC
Start: 2014-07-14 — End: 2014-07-14
  Administered 2014-07-14: 1000 mg via INTRAVENOUS
  Filled 2014-07-14: qty 100

## 2014-07-14 MED ORDER — VALPROATE SODIUM 500 MG/5 ML (100 MG/ML) INTRAVENOUS SOLUTION
1000.0000 mg | Freq: Once | INTRAVENOUS | Status: AC
Start: 2014-07-14 — End: 2014-07-14
  Administered 2014-07-14: 1000 mg via INTRAVENOUS
  Filled 2014-07-14: qty 10

## 2014-07-14 MED ORDER — CLONAZEPAM 0.5 MG TABLET
0.5000 mg | ORAL_TABLET | Freq: Once | ORAL | Status: AC
Start: 2014-07-14 — End: 2014-07-14
  Administered 2014-07-14: 0.5 mg via ORAL
  Filled 2014-07-14: qty 1

## 2014-07-14 MED ORDER — ZONISAMIDE 100 MG CAPSULE
200.0000 mg | ORAL_CAPSULE | Freq: Once | ORAL | Status: AC
Start: 2014-07-14 — End: 2014-07-14
  Administered 2014-07-14: 200 mg via ORAL
  Filled 2014-07-14: qty 2

## 2014-07-14 MED ORDER — CLONAZEPAM 0.5 MG DISINTEGRATING TABLET
0.5000 mg | DISINTEGRATING_TABLET | ORAL | Status: AC | PRN
Start: 2014-07-14 — End: 2014-10-12

## 2014-07-14 MED ORDER — LEVETIRACETAM 500 MG TABLET
750.0000 mg | ORAL_TABLET | Freq: Two times a day (BID) | ORAL | Status: DC
Start: 2014-07-14 — End: 2014-07-15
  Administered 2014-07-14 – 2014-07-15 (×2): 750 mg via ORAL
  Filled 2014-07-14 (×2): qty 1

## 2014-07-14 NOTE — Nurse Focus (Signed)
2210 event button pushed pt reports feeling disoriented and metallic taste in his mouth.   2215 even button pushed pt unable to speak but holds up thumbs up when asked. Pt also fiddling with pulse ox cord. Pt noted to be grinding teeth. Event lasted about 45 seconds. Pt then able to answer orientation questions but still seems slightly disoriented. Pt back to baseline after 10 minutes.       Kate Sablearissa Nolyn Eilert RN

## 2014-07-14 NOTE — Nurse Assessment (Signed)
ASSESSMENT NOTE    Note Started: 07/14/2014, 23:49     Initial assessment completed and recorded in EMR.  Report received from day shift nurse and orders reviewed.  Pt admitted for EEG monitoring. Pt completed monitoring and has been removed from monitor. Pt is alert and oriented. Pt reports no aura this evening. Pt ambulated to bathroom/shower this evening.  Plan of Care reviewed and appropriate, discussed with patient.        Kate Sablearissa Benny Henrie,  RN

## 2014-07-14 NOTE — Nurse Assessment (Signed)
ASSESSMENT NOTE    Note Started: 07/14/2014, 01:33     Initial assessment completed and recorded in EMR.  Report received from day shift nurse and orders reviewed.  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   Pt is alert and oriented. EEG monitors in place, seizure precautions in place. Pt with one even this evening at 2215. Back to baseline after 10 minutes.  Plan of Care reviewed and appropriate, discussed with patient.        Kate Sablearissa Shavette Shoaff,  RN

## 2014-07-14 NOTE — Plan of Care (Signed)
Problem: Patient Care Overview (Adult)  Goal: Plan of Care Review  Outcome: Ongoing (interventions implemented as appropriate)  Goal Outcome Evaluation Note    Peter Deleon is a [age]yr male admitted 07/10/2014      OUTCOME SUMMARY AND PLAN MOVING FORWARD:   Pt  Awake,  Alert, had 1 event this morning, evaluated by [facility]RO doc, Dr Maryfrances BunnellAnsari loaded with keppra, see mar for other meds.  Up and about. Reeval, EEG leads taken out possible d/c tomorrow per  Doc.  No acute distress noted.    Problem: Sleep Pattern Disturbance (Adult)  Goal: Adequate Sleep/Rest  Patient will demonstrate the desired outcomes by discharge/transition of care.   Outcome: Ongoing (interventions implemented as appropriate)    Problem: Fall Risk (Adult)  Goal: Absence of Falls  Patient will demonstrate the desired outcomes by discharge/transition of care.   Outcome: Ongoing (interventions implemented as appropriate)    Problem: Seizure Disorder/Epilepsy (Adult)  Goal: Signs and Symptoms of Listed Potential Problems Will be Absent or Manageable (Seizure Disorder/Epilepsy)  Signs and symptoms of listed potential problems will be absent or manageable by discharge/transition of care (reference Seizure Disorder/Epilepsy (Adult) CPG).   Outcome: Ongoing (interventions implemented as appropriate)

## 2014-07-14 NOTE — Discharge Instructions (Signed)
Mr. Peter Deleon,    You were monitored on video EEG for 5 days and after your medications were tapered off and VNS was turned off, you had 5 seizures. We were able to get an idea of where your seizures are coming from. Please follow up in Dr. Patrici RanksSeyal's clinic to determine what the next step in treatment is for you.     As for your medications, we started you back on your home mediations with the following changes: depakote was decreased from 1000 mg twice a day to 750 mg twice a day, and keppra was increased from 500 mg twice a day to 750 mg twice a day. We are also prescribing you a rescue medication called clonazepam. The dose will be 0.5 mg (the lowest dose) as it is a very sedating medicine. However, it is useful for you to use if you have a cluster of seizures, of if you have a strong feeling that you will have a seizure again. This medication should not be taken more than three times in a single day. This should not stop you from calling 911 if you have a seizure lasting longer than 3 minutes or if you have multiple seizures within a 30 minute period.     Here, as always are instructions for your safety if you are having a seizure and for people with seizures:    SAFETY:  Patients at risk for seizures that involves loss of consciousness must avoid:  - Driving (at least 3-6 months after a seizure...   ...UNTIL A PHYSICIAN STATES THAT IT IS OK TO DRIVE  - unattended baths, unattended swimming  - power tools, heavy machinery  - ladders, high platforms, roofs  - any other activity that could pose a safety risk if there was a loss of consciousness or uncontrolled body movements    For any seizure involving loss of consciousness, do the following:  1. Don't panic - take a deep breath  2. Look at the clock to see what time the seizure started.   3. If the patient is shaking for more than 5 minutes - or has repeated seizures without returning to normal in between, call 911; this is an emergency.  3. As much as possible, move  things away from the patient's head to avoid injury  4. Use pillows or towels to protect the from injury  5. Put them on a bed, couch, or the floor ON THEIR SIDE. This is to avoid a condition called aspiration pneumonia, when vomit goes into the lungs.  6. DO NOT PUT ANYTHING IN THEIR MOUTH. Patients don't swallow their tongues despite the old wives tale. However, putting things inside the mouth could lead to choking hazard.  7. When the seizure is over and you are sure they are safe, please call the neurology clinic [phone] to let us know about a seizure. (calls are usually returned within 1 business day)

## 2014-07-14 NOTE — Plan of Care (Signed)
Problem: Patient Care Overview (Adult)  Goal: Plan of Care Review  Outcome: Ongoing (interventions implemented as appropriate)  Goal Outcome Evaluation Note    Peter Deleon is a 47yrmale admitted 07/10/2014      OUTCOME SUMMARY AND PLAN MOVING FORWARD:   Pt with 1 event over night.  Pt then slept a short time then was awake. Pt complaining of headache over night, states he has been drinking less caffeine while in hospital and believes that's what headache is from      Goal: Individualization and Mutuality  Outcome: Ongoing (interventions implemented as appropriate)  Goal: Discharge Needs Assessment  Outcome: Outcome(s) achieved Date Met:  07/14/14    Problem: Sleep Pattern Disturbance (Adult)  Goal: Adequate Sleep/Rest  Patient will demonstrate the desired outcomes by discharge/transition of care.   Outcome: Ongoing (interventions implemented as appropriate)    Problem: Fall Risk (Adult)  Goal: Absence of Falls  Patient will demonstrate the desired outcomes by discharge/transition of care.   Outcome: Ongoing (interventions implemented as appropriate)    Problem: Pain, Chronic (Adult)  Goal: Acceptable Pain Control/Comfort Level  Patient will demonstrate the desired outcomes by discharge/transition of care.   Outcome: Ongoing (interventions implemented as appropriate)    Problem: Seizure Disorder/Epilepsy (Adult)  Goal: Signs and Symptoms of Listed Potential Problems Will be Absent or Manageable (Seizure Disorder/Epilepsy)  Signs and symptoms of listed potential problems will be absent or manageable by discharge/transition of care (reference Seizure Disorder/Epilepsy (Adult) CPG).   Outcome: Ongoing (interventions implemented as appropriate)

## 2014-07-14 NOTE — Discharge Summary (Addendum)
NEUROLOGY SERVICE  HOSPITAL DISCHARGE SUMMARY    PATIENT:  Peter Deleon  MRN:         [id number]    NOTE DATE / TIME:  07/14/2014  @ 19:56    Admission date:  07/10/2014  8:37 AM    Discharge date:  07/15/2014   Attending at discharge:  Dr. Kyung RuddKennedy  Senior resident:  Dr. Maryfrances BunnellAnsari    DISCHARGE DIAGNOSES & PMH:  Right Temporal Lobe Epilepsy  Bipolar Disorder    No past medical history on file.    DISCHARGE MEDICATIONS:   Peter JollyDakis, Peter   Home Medication Instructions LKG:[id number]    Printed on:07/14/14 1956   Medication Information                      Calcium-Cholecalciferol, D3, (OSCAL 500+D) 500 mg(1,250mg ) -200 unit per tablet  Take 1 tablet by mouth 2 times daily with meals.             Clonazepam (KLONOPIN) 0.5 mg Disintegrating Tablet Disintegrating Tablet  Take 1 tablet by mouth if needed (cluster of seizures).             Divalproex (DEPAKOTE) 250 mg Delayed Release Tablet  Take 3 tablets by mouth 2 times daily.             LevETIRAcetam (KEPPRA) 250 mg Tablet  Take 3 tablets by mouth 2 times daily.             Levocarnitine (CARNITOR) 330 mg Tablet  Take 1 tablet by mouth 2 times daily.             LevoTHYROxine (SYNTHROID) 75 mcg Tablet  Take 75 mcg by mouth every morning before a meal.             MULTIVITAMIN PO               NABUMETONE (RELAFEN PO)  Take 750 mg by mouth 2 times daily.             Omeprazole (PRILOSEC) 20 mg Delayed Release Capsule  Take 20 mg by mouth once daily before a meal.             Tamsulosin (FLOMAX) 0.4 mg Capsule  Take 0.4 mg by mouth every day at bedtime.             VENLAFAXINE HCL (EFFEXOR PO)  Take 150 mg by mouth every day at bedtime.             Zonisamide (ZONISAMIDE) 100 mg Capsule  Take 2 capsules by mouth 2 times daily.                 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     HOSPITAL COURSE:    #Right Temporal Lobe Epilepsy  Patient was monitored for 5 days and was noted on day 4 (after tapering off his medications and turning off his VNS) that  he started having seizures. He started having auras on day 2, of him feeling a sudden brief confusion/fear that he would have a seizure, without any EEG correlate. However, on day 4, he had a similar feeling but this was accompanied by a metallic taste in his mouth, and then his typical seizure semiology of initial aura, followed by inability to speak, decreased interactiveness, looking around/hyperkinetic movements (tends to focus on left hand), and finally oral automatisms. At times there are left hand clonic movements and decreased strength or neglect of left side during the seizures. They lasted 1.5 to 2.5 minutes with impairment in consciousness as evidenced by an inability to follow commands (though able to push button at the start).     Ictal Events:  Patient's first episode occurred at 0955 on 07/13/14 and lasted 2 minutes and 3 seconds with impairment in consciousness. The second episode occurred at 1612 on 07/13/14 and lasted 2 minutes and 33 seconds and also had impairment in consciousness. The third episode occurred at 2215 on 07/13/14 and lasted 1 minute and 43 seconds and at first patient was unable to speak but still following commands. The fourth seizure occurred at 0644 on 07/14/14 and lasted 2 minutes and 10 seconds with retained ability to speak albeit slower to respond and more difficulty in initiating speech noted. The fifth seizure occurred at 0944 on 07/14/14 and lasted 1 minute and 24  seconds and started with patient on the phone and he continued to hold the phone up but stopped talking on the phone, and kept pulling the phone away and putting it back, able to say "seizure" when RN queries him.     All of these seizures were noted to have a right temporal onset.    Prior to discharge, patient's medications were resumed but with a small adjustment. Instead of taking depakote 1000 mg BID, after 1000 mg loading dose, patient was restarted on 750 mg BID. Instead of taking keppra 500 mg BID, after 1000 mg loading dose, patient was restarted on 750 mg BID. Patient's zonisamide was continued at 200 mg BID. Patient's VNS setting were resumed with the following adjustment: instead of output current being at 1.00 mA, it was changed to 0.75 mA due to patient having increasing throat irritability/coughing at the 1.00 mA setting, but increase in pulse width was tolerated. Setting changes as below:    VNS Programming/Interrogation, 07/11/2014  Battery: Full  Output current: 1.00 mA-->0.19mA  Signal frequency: 30 Hz  Pulse width: 130 uSec  Signal On time: 30 sec   Signal Off time: 5.0 min   Magnet triggered current: 0.75 mA -->0.5mA  Magnet on time: 30 sec  Magnet pulse width: 250 uSec    Vagal Nerve Stimulator Interrogation 07/14/2014  Output current: 0.00 mA --> 0.75 mA  Signal frequency: 30 Hz  Pulse width: 130 uSec  Signal On time: 30 sec   Signal Off time: 5.0 min   Magnet triggered current: 0.00 mA --> 0.75 mA  Magnet on time: 30 sec  Magnet pulse width: 250 uSec    Vagal Nerve Stimulator Interrogation 07/15/2014  Output current: 0.75 mA  Signal frequency: 30 Hz  Pulse width: 130 uSec --> 250 mA  Signal On time: 30 sec   Signal Off time: 5.0 min   Magnet triggered current: 0.75 mA  Magnet on time: 30 sec  Magnet pulse width: 250 uSec    Patient's drug levels on admission were significant for a normal zonisamide level, low depakote level, and  low keppra level. However, patient noted to have  hyperammonemia to 91 and despite reduction in dose and adding levocarnitine, was still 70. Recheck on day of discharge was pending at time of this note. As a result, in conjunction with the fact that patient was complaining of somnolence, decreased dose to 750 mg BID.    #Bipolar Disorder  Patient stated that he was not taking abilify or fluvoxamine on admission, so these were discontinued. Patient still kept on venlafaxine.     COMPLICATIONS / ADVERSE DRUG EVENTS:  None    PERTINENT LABS / STUDIES:  Levetiracetam level 07/10/14: 5.5 (L)  Valproate level 07/10/14: 40 (L)  Zonisamide level 07/10/14: 14    Ammonia level 07/10/14 - 91 (H)  Ammonia level 07/12/14 - 70 (H)    BRIEF DISCHARGE EXAM:    Vitals:  BP 133/78 mmHg  Pulse 100  Temp(Src) 37 C (98.6 F) (Oral)  Resp 18  Ht 1.727 m (5\' 8" )  Wt 95.664 kg (210 lb 14.4 oz)  BMI 32.07 kg/m2  SpO2 97%    General: NAD, lying in bed, EEG leads in place   Mental status: Alert, appropriately answers questions. Speech clear and appropriate   Cranial nerves: PERRL 4->103mm. EOM intact. Facial sensation and strength intact and symmetric.   Motor: Deltoids, biceps, triceps, grip, hip flexors, knee flexors/extensors, plantarflexion/dorsiflexion 5/5 in BUE and BLE   Sensory: Intact to light touch throughout     CONDITION:  Patient is stable for discharge.  DISPOSITION:  Discharge to home.    DISCHARGE INSTRUCTIONS:  - start taking depakote 750 mg BID (instead of 1000 mg BID)  - start taking keppra 750 mg BID (instead of 500 mg BID)  - start taking levocarnitine 330 mg BID (for high ammonia)  - start taking clonazepam 0.5 mg tablet as needed for cluster of seizures    SAFETY:   Patients at risk for seizures that involves loss of consciousness must avoid:   - Driving (at least 3-6 months after a seizure...   ...UNTIL A PHYSICIAN STATES THAT IT IS OK TO DRIVE   - unattended baths, unattended swimming   - power tools, heavy machinery   - ladders, high platforms, roofs   -  any other activity that could pose a safety risk if there was a loss of consciousness or uncontrolled body movements    For any seizure involving loss of consciousness, do the following:   1. Don't panic - take a deep breath   2. Look at the clock to see what time the seizure started.   3. If the patient is shaking for more than 5 minutes - or has repeated seizures without returning to normal in between, call 911; this is an emergency.   3. As much as possible, move things away from the patient's head to avoid injury   4. Use pillows or towels to protect the from injury   5. Put them on a bed, couch, or the floor ON THEIR SIDE. This is to avoid a condition called aspiration pneumonia, when vomit goes into the lungs.   6. DO NOT PUT ANYTHING IN THEIR MOUTH. Patients don't swallow their tongues despite the old wives tale. However, putting things inside the mouth could lead to choking hazard.  7. When the seizure is over and you are sure they are safe, please call the neurology clinic [phone] to let us know about a seizure. (calls are usually returned within 1 business day)  OUTPATIENT FOLLOW-UP PLAN:  - follow up with PCP on regular schedule  - make follow up appointment in [facility] Epilepsy clinic with Dr. Trilby Drummer  - Reschedule your neuropsychology appointment with Dr. Enid Cutter    PENDING STUDIES AT DISCHARGE:  Ammonia    Report Electronically Signed By:  Alphonzo Cruise, MD (Resident)  [facility] Department of Neurology, Pager: [phone]      ATTENDING ADDENDUM  The patient was seen and examined.  I reviewed and agree with the resident's assessment and plan we developed as outlined in the resident's note with the exception of any addendum noted below:    More than 35 minutes was spent in discharge documentation and 20 minutes was spent in neurostimulation device programming (Vagal Nerve Stimulator).     Report electronically signed by Tyler Pita, MD. Attending

## 2014-07-14 NOTE — Progress Notes (Addendum)
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     24 HOUR EVENTS:   -Spells: yes, 3 seizures (at 2215 on 5/26 lasting 1:43, at 0644 on 5/27 lasting 2:10, and at 0944 on 5/27 lasting 1:24)  -EEG correlation: yes, right temporal onset  -Medication changes: Has been off all medications >24 hours   -EEG to continue for the next 24 hrs: yes    S: Patient reports no acute complaints at this time. He states that he has had this intense aura intermittently the last 24 hours corresponding with his seizures. Discussed that we have captured 5 seizures all with right temporal onset, which is sufficient for this admission, however, that we would like to monitor him throughout the day today to make sure that as we put him back on his medications that he does not seize again. We will be able to discharge him tomorrow, 07/15/14, if he remains seizure free. Patient is agreeable.     Also, patient wished to discuss his depakote dosing. He states that he is concerned about the somnolence that he has been experiencing with it, along with the "jitteriness" or tremulousness. He states he was initially placed on it for his bipolar disorder, but then it was titrated up to assist with seizure control. He states that especially in the setting of hyperammonemia that he had one admission, he would like to consider lowering the dose. Discussed that he should stay on the dose until he sees Dr. Trilby Drummer in clinic, but will discuss with attending  on service today.    O:  CURRENT MEDICATIONS  Scheduled:Aripiprazole (ABILIFY) Tablet 2 mg, ORAL, QAM  Calcium-Cholecalciferol (D3) (OSCAL 500+D) Tablet 1 tablet, ORAL, BID w/ meals  Docusate (COLACE) Capsule 100 mg, ORAL, BID  Fluvoxamine (LUVOX) Tablet 50 mg, ORAL, Daily Bedtime  Heparin PF 5,000 units/0.5 mL Injection 5,000 Units, SUBCUTANEOUS, Q8H (45,40,98)  LevETIRAcetam (KEPPRA) 1,000 mg in NaCl (iso-osm) 100 mL IVPB, IV, ONCE  Levocarnitine (CARNITOR) Tablet 330 mg, ORAL, BID  LevoTHYROxine (SYNTHROID) Tablet 75 mcg, ORAL, QAM AC  Multivitamin (HEXAVITAMIN) Tablet 1 tablet, ORAL, QAM  Pantoprazole (PROTONIX) Delayed Release Tablet 20 mg, ORAL, QAM AC  Tamsulosin (FLOMAX) Capsule 0.4 mg, ORAL, Daily Bedtime  Valproate Sodium (DEPACON) 1,000 mg in D5W 100 mL IVPB, IV, ONCE  Venlafaxine (EFFEXOR) Tablet 150 mg, ORAL, QAM w/ meal    IV Fluids & Drips:   JXB:[id number] (TYLENOL) Tablet 650 mg, ORAL, Q4H PRN  Lorazepam (ATIVAN) Injection 1 mg, IV, PRN  Naproxen (NAPROSYN) Tablet 500 mg, ORAL, Q8H PRN       VITALS:     Current  Minimum Maximum   BP BP: 120/81 mmHg  BP: (120-150)/(81-98)    Temp Temp: 37 C (98.6 F)  Temp  Min: 37 C (98.6 F)  Temp Max: 37 C (98.6 F)    Pulse Pulse: 111 Pulse Min: 109  Pulse Max: 111    Resp Resp: 18 Resp Min: 16  Resp Max: 18    O2 Sat SpO2: 95 % SpO2 Min: 95 % SpO2 Max: 96 %   O2 Deliv None ; No Data Recorded      SpO2: 95 %  Pulse: 111    24 HOUR INTAKE/OUTPUT:  I/O Last 2 Completed Shifts:  In: 950 [Oral:950]  Out: -     EXAM  General:  NAD, lying in bed, EEG leads in place   Mental status:  Alert, appropriately answers questions. Speech clear and appropriate   Cranial nerves:  PERRL 4->333mm. EOM intact. Facial sensation and strength intact and symmetric.   Motor:  Deltoids, biceps, triceps, grip, hip flexors, knee flexors/extensors, plantarflexion/dorsiflexion 5/5 in BUE and BLE   Sensory:  Intact to light touch throughout     DIAGNOSTIC STUDIES  POC Glucose, blood: --  No  results found for this visit on 07/10/14 (from the past 24 hour(s)).     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     PLAN:   -Continue video EEG telemetry monitoring until tomorrow morning 07/15/14 (so long as no additional seizures, can disconnect in the morning)  -IV loading dose of keppra 1 g x 1, depacon 1 g x 1  -clonazepam 0.5 mg PO x 1  -resume home AEDs but as follows: depakote 750 mg BID, keppra 750 mg BID (change discussed with patient after rounds due to somnolence with depakote), zonisamide 200 mg BID  -Continue Carnitor 1g/day for elevated ammonia   -restart VNS at previous settings: Output current 1.00 mA, magnet triggered current 0.75 mA  -will send repeat ammonia after depacon infusion    VET lab protocol:  -Seizure precautions / padded bed rails.  -Door to room to always remain open, sitter must be present 24 hours a day.  -Continuous pulse oximetry with alarm set at 85% saturation or less.   -RN to notify team for generalized convulsive seizure lasting more than 2 minutes,   two or more convulsive seizures within a 60 minute period, or two or more seizures without complete recovery of consciousness beween seizures.  -Ativan 1mg  is to be given for generalized convulsive seizures lasting more than 2  minutes, two or more generalized convulsive seizures within any 8 hour period, or three or more seizures of any duration accompanied by alteration of consciousness in any 12 hour period.    F/E/N: Saline lock, Regular diet. Replete Lytes PRN.  DVT/GI PROPHYLAXIS: SQH. ALPs. Colace.  CODE STATUS: Full  DISPOSITION: Home     [Based on literature "the shortest reported average time  for successful, clinically relevant long-term monitoring was 4.8 - 7.6 days" and "video EEG monitoring combined with sleep deprivation and protocolized rapid antiepileptic drug withdrawal is a safe & effective investigative technique with no adverse long-term sequela."  1) Ghougassian, D. F., et al. "Evaluating the Utility of Inpatient Video-EEG Monitoring." Epilepsia 45.8 (2004): 928-32. Web.   2) Rizvi, S. A.a., et al. "Is Rapid Withdrawal of Anti-epileptic Drug Therapy during Video EEG Monitoring Safe and Efficacious?" Epilepsy Research 108.4 (2014): 755-64. Web.   3) Velis, D., et al. "Recommendations Regarding the Requirements and Applications for Long-term Recordings in Epilepsy." Epilepsia 48.2 (2007): 379-84. Web.]      PRESENT ON ADMISSION:  Are any of the following five conditions present or suspected on admission: decubitus ulcer, infection from an intravascular device, infection due to an indwelling catheter, surgical site infection, or pneumonia? No.    Patient will be discussed with attending Epileptologist, Dr. Kyung Rudd.     Report Electronically Signed By:  Alphonzo Cruise, MD  [facility] Neurology PGY-3  Pager: [phone]  After 6 PM and on weekends please call Consult Pager: [phone]      The patient was seen and examined.  I reviewed and agree with the resident's assessment and plan we developed as outlined in the resident's note.  Report electronically signed by Argentina Ponder, MD. Attending

## 2014-07-14 NOTE — Nurse Focus (Signed)
[id number] pt pushed button. Pt with teeth grinding, and blank stare. Event lasted around 30 seconds. Pt then able to state his name location and this RNs name. Pt continues to feel disoriented but is able to answer questions. HR max 145 oxygen remained 95-97%      Scherry Ranarissa Ashford Clouse RN

## 2014-07-14 NOTE — Progress Notes (Signed)
NEUROLOGY PGY-3 PROGRESS NOTE UPDATE 07/14/2014 @ 19:37    Went to patient's bedside to turn his VNS back on. Attempted to turn it back on to previous settings with output current 1.00 mA and magnet current 0.75 mA. However, patient with coughing fit, which was very bothersome to the patient, so cut the output current down to 0.50 mA. This was more tolerable. Waited 30 minutes and then increased the output current again, now up to 0.75 mA. This resulted in some voice hoarseness but no coughing fit, and was tolerable to the patient. Patient also demonstrated using his magnet with the 0.75 mA current and this was also tolerable. Asked patient to let us know in the morning if he tolerates the 0.75 output current, and if so, can attempt to increase again back to home regimen of 1.00 mA.    Current VNS settings:  Vagal Nerve Stimulator Interrogation   07/14/2014  Output current: 0.00 mA --> 0.75 mA  Signal frequency: 30 Hz  Pulse width: 130 uSec  Signal On time: 30 sec   Signal Off time: 5.0 min   Magnet triggered current: 0.00 mA --> 0.75 mA  Magnet on time: 30 sec  Magnet pulse width: 250 uSec    Report Electronically Signed By:  Alphonzo CruiseMustafa Brixton Schnapp, MD  [facility] Neurology PGY-3

## 2014-07-15 NOTE — Plan of Care (Signed)
Problem: Patient Care Overview (Adult)  Goal: Plan of Care Review  Outcome: Ongoing (interventions implemented as appropriate)  Goal Outcome Evaluation Note    [name] is a [age]yr male admitted 07/10/2014      OUTCOME SUMMARY AND PLAN MOVING FORWARD:   No events over night. Pt slept well and is ready to go home today.   Goal: Individualization and Mutuality  Outcome: Ongoing (interventions implemented as appropriate)    Problem: Sleep Pattern Disturbance (Adult)  Goal: Adequate Sleep/Rest  Patient will demonstrate the desired outcomes by discharge/transition of care.   Outcome: Ongoing (interventions implemented as appropriate)    Problem: Fall Risk (Adult)  Goal: Absence of Falls  Patient will demonstrate the desired outcomes by discharge/transition of care.   Outcome: Ongoing (interventions implemented as appropriate)    Problem: Pain, Chronic (Adult)  Goal: Acceptable Pain Control/Comfort Level  Patient will demonstrate the desired outcomes by discharge/transition of care.   Outcome: Ongoing (interventions implemented as appropriate)    Problem: Seizure Disorder/Epilepsy (Adult)  Goal: Signs and Symptoms of Listed Potential Problems Will be Absent or Manageable (Seizure Disorder/Epilepsy)  Signs and symptoms of listed potential problems will be absent or manageable by discharge/transition of care (reference Seizure Disorder/Epilepsy (Adult) CPG).   Outcome: Ongoing (interventions implemented as appropriate)

## 2014-07-15 NOTE — Nurse Discharge Note (Signed)
Discharge NOTE -    Note Started: 07/15/2014, 19:25     Patient discharged per NRO Dr. Nolon Nationsasani to  Home  at 1500. Discharge teaching including, signs and symptoms of infection, signs and symptoms of problems that could occur secondary to their admitting diagnosis or underlying health care issues,activities or any restrictions upon discharge, discharge medications, and follow up appointments reviewed with patient and family.All questions or concerns answered upon discharge.  Patient's belonging's sent out with patient.    Kai LevinsEvelyn  Mettie Roylance RN

## 2014-07-15 NOTE — Nurse Assessment (Signed)
RN ASSESSMENT    Note started 07/15/2014 12:12         Initial assessment completed and recorded in EMR.  Report received from night shift nurse and orders reviewed. Plan of Care reviewed and appropriate, discussed with patient.      Aurea GraffEvelyn T Antiono Ettinger, RN

## 2014-07-18 ENCOUNTER — Other Ambulatory Visit: Payer: Self-pay | Admitting: Neurology

## 2014-07-19 ENCOUNTER — Telehealth: Payer: Self-pay | Admitting: NEUROLOGY

## 2014-07-19 NOTE — Telephone Encounter (Signed)
Patient calling in states he was prescribed Carnitor.  He would like to know why this was prescribed and what he should be taking this for.  Please advise      Dillard EssexLeah Brown, MOSC III  Neurology, [facility]  [phone] patient line  [phone] backline

## 2014-07-19 NOTE — Telephone Encounter (Signed)
Please notify pt it was because his ammonia level in his blood was elevated.  This is a common side effect of Depakote.    Raynelle FanningJulie

## 2014-07-19 NOTE — Telephone Encounter (Signed)
TC to patient regarding message below, 2 pt identifiers used, relayed message from WolvertonJulie.  pt's spouse stated she was told by her daughter who is pharmacist Carnitor has so many SE include Seizure.  Advise pt to call clinic if he has SE.  Also recommend to discuss SE in next visit.  Pt verbalized understanding and is agreeable to plan.  Also stated PA require.      Marisol have you received PA request?  Kennon RoundsSally Nishat Livingston,RN/Neurology

## 2014-07-20 NOTE — Telephone Encounter (Signed)
Please notify the patient of the following:  The package insert for Carnitor lists seizure as an adverse effect.  The patients in the clinical trials were having seizures which is why they were taking Depakote.  Carnitor was added to reduce ammonia levels due to Depakote.  Hence, Carnitor does not cause seizures.  However, for clinical trials they have to list every adverse effect that someone reports.  The adverse effects are not necessarily related.

## 2014-07-21 NOTE — Telephone Encounter (Signed)
PA (Prior Authorization) request was completed on 07/20/14 and fax to patient Drug Plan-[employer] at [phone] for Levocarnitine (CARNITOR) 330mg  Tablet-Qty 60/30 Day Supply.    Mosie EpsteinMarisol Carrillo MOSC III  Neurology Dept

## 2014-07-24 NOTE — Telephone Encounter (Signed)
Received DENIAL from [employer] Plan for Levocarnitine (CARNITOR) 330mg  Tablet. Appeals forms place MD box to be completed and fax back to: [employer] Appeals Dept[employer] [address] [address], [address], [place], [place] [id number], Phone [phone], Expedited Appeals Fax [phone]. Member's ID# [id number]. Message sent to MD for determination!    Mosie EpsteinMarisol Carrillo MOSC III  Neurology Dept

## 2014-08-08 NOTE — Telephone Encounter (Signed)
Generic Levocarnitine 330mg  Tablet is covered benefit with no restrictions thru [employer] Part [employer] Plan, inform Pharmacy back 07/31/14. Per Pharmacy patient pick up medication on 08/05/14.    Peter Deleon  Neurology Dept

## 2014-08-11 NOTE — Procedures (Addendum)
[facility]UR[facility]  VIDEO EEG TELEMETRY  EPILEPSY MONITORING UNIT REPORT    MR #: [id number]  DOB: 06/04/1961  SEX: male  AGE: 56yr   STUDY NUMBER: [id number]  STUDY START DATE & TIME: 07/10/2014 at 14:53  STUDY STOP DATE & TIME: 07/14/2014 at 14:21  ORDERING PHYSICIAN: Dr. Trilby Drummer    HISTORY:  54yr old male with a history of Bipolar disorder and intractable epilepsy admitted for evaluation for nonpharmacological treatment of epilepsy, such as epilepsy surgery.    DESCRIPTION OF PROCEDURE:  EEG was recorded from the standard scalp 10-20 locations as well as the T1 and T2 electrodes and bilateral mandibular notch electrodes.  Nasal airflow and abdominal excursions, three-channel EKG, including leads II, V1 and V5, oxygen saturation using digital pulse oximetry and end-tidal CO2 were recorded throughout this study and synchronized with the video EEG recording.     INTERICTAL DESCRIPTION:  AWAKE  Background: 9-10 Hz; posterior head regions; symmetric, waxing and waning, reactive to eye opening and closure  Beta: 18-20 Hz; frontocentral, symmetric, waxing and waning    SLEEP  Vertex waves: symmetric  Sleep spindles: 12-15 Hz; frontocentral, symmetric  POSTs: posterior head regions, synchronous and asynchronous, often in runs  Slow-wave sleep: 1-2 Hz; generalized, maximum frontal; irregular, polymorphic      Interictal Non-Epileptiform Abnormalities  Intermittent Slow: 2.5-3 Hz; lateralized right hemisphere, maximum frontal; rhythmic;seen in paroxysms lasting 5-6 seconds during lethargy    Interictal Epileptiform Abnormalities  Spikes/Sharp Waves: Lateralized right hemisphere, maximum frontotemporal    ICTAL DESCRIPTION:  Event #1 (14:16 on 07/12/2014)  Clinical Description: patient reports an aura and activates the event button.  Patient has difficulty describing his aura symptoms.  EEG Description: No change    Event #2 (17:22 to on 07/12/2014)  Clinical Description:  patient reports an aura and activates the event button.   Patient has difficulty describing his aura symptoms  EEG Description: No change    Seizure #1-5 (9:57 16:11, 22:15 on 07/13/2014; 06:44, 09:44 on 07/14/2014)  Clinical Description: During seizure #1, the patient begins to fumble with objects in his bed using his right hand more than left.  Nursing staff ask the patient several questions, which she does not respond.  The patient given words to remember.  He does not follow any commands.  At one point the patient except the television controller and appears to be looking towards his television and changes channels according to nurse at the bedside and then holds his hands over his chest.  He remains in this position after the seizure resolves and slowly begins to interact with nursing staff.  No postictal aphasia is noted and the patient does not recall clinical testing during the seizure.  during the second and third seizures, the patient has repetitive oral 1.5-2 Hz movements and have similar hand automatisms/fumbling with objects at the bedside. The patient reports to nursing that he is going to have a seizure before seizure #3, he is asked what he is feeling and does not answer. Patient is observed to have oral automatisms, appears "clammy" to his nurse. He is asked to show "thumbs up" and "touch your nose" but does not initially answer questions. In the final 20 seconds of the EEG seizure, the patient answers orientation questions. After the event, the patient described aura as "extreme confusion."  Seizures #4 and 5 had similar semiology and patient responds to simple questions during clinical testing.      EEG Description: Abrupt onset rhythmic sharply contoured alpha activity  is seen over the right hemisphere, maximal over the temporal region. After approximately 20 seconds, this activity evolves this activity evolves to a rhythmic theta activity with superimposed beta frequencies over the right hemisphere and slows to a higher amplitude 3-4 frequency after an  additional 20-30 seconds and then continues to slpw to 1-2 Hz and 1 Hz before ending abruptly approximately 1:45-2 minutes after onset.    IMPRESSION:  This 5 day epilepsy monitoring unit admission captured 2 aura without EEG correlate and 5 seizures seizures arising over the right hemisphere. The patient's clinical semiology consisted of an difficult to describe aura then evolving to oral automatisms and hand automatisms. The patient was able to respond to questions and follow simple commands during some of the seizures and other seizure or portions of seizures, he was not able to respond or follow commands. The interictal recording is significant for infrequent spikes and more frequent intermittent rhythmic slowing over the right hemisphere, maximal over the frontal region.     Adonis Huguenin   Assistant Professor  Neurology

## 2014-09-05 ENCOUNTER — Encounter: Payer: Self-pay | Admitting: NEUROLOGY

## 2014-09-05 ENCOUNTER — Ambulatory Visit: Payer: Medicare Other

## 2014-09-05 ENCOUNTER — Ambulatory Visit: Payer: Medicare Other | Attending: NEUROLOGY | Admitting: NEUROLOGY

## 2014-09-05 VITALS — BP 141/82 | HR 95 | Temp 97.2°F | Resp 20 | Ht 68.0 in | Wt 223.5 lb

## 2014-09-05 DIAGNOSIS — R569 Unspecified convulsions: Principal | ICD-10-CM | POA: Insufficient documentation

## 2014-09-05 LAB — AMMONIA: AMMONIA: 61 umol/L — AB (ref 2–30)

## 2014-09-05 MED ORDER — MODAFINIL 100 MG TABLET
100.0000 mg | ORAL_TABLET | Freq: Every day | ORAL | Status: DC
Start: 2014-09-05 — End: 2014-10-20

## 2014-09-05 MED ORDER — LEVOCARNITINE 330 MG TABLET
660.0000 mg | ORAL_TABLET | Freq: Two times a day (BID) | ORAL | Status: DC
Start: 2014-09-05 — End: 2016-02-01

## 2014-09-05 NOTE — Progress Notes (Addendum)
Stephannie Peters, MD  [address]  [place] [address]    PATIENT:  Peter Deleon  MRN:         [id number]  DATE:        09/05/2014    Dear Dr. Marthe Patch:    Our mutual patient, Khoury Siemon, was seen today at the [facility] as a follow up for seizure management. He was last seen at this clinic on 11/10/12. He was driven to clinic by a friend.    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 Helene Shoe. Patient is here as a follow up for seizure management.     Patient was recently admitted 07/10/2014, due to lack of correlation between MRI reports and EEG. As per records  OSH EEG 11/2011 which showed abnormal slowing of the left hemispheric sharp and slow activity with "phase reversal". He has been offered L temporal epilepsy surgery in the past but decided against it. This was offered by Dr. Isaiah Serge of [facility]. Patient was afraid of losing his memory and language because a relative had the same problem and had complications afterwards. 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 Given the disparity between the two, patient was admitted for continuous video EEG monitoring for further localization.     Patient was monitored for 5 days and was noted on day 4 (after tapering off his medications and turning off his VNS) that he started having seizures. He started having auras on day 2, of him feeling a sudden brief confusion/fear that he would have a seizure, without any EEG correlate. However, on day 4, he had a similar feeling but this was accompanied by a metallic taste in his mouth, and then his typical seizure semiology of initial aura, followed by inability to speak, decreased interactiveness, looking around/hyperkinetic movements (tends to focus on left hand), and finally oral automatisms.  All of these seizures were noted to have a right temporal onset.    Prior to discharge the patients medications were adjusted- Depakote was decreased from 1000 BID to 750 BID due to ammonia levels of 90. Keppra was subsequently increased from 500 mg BID to 750 BID. And Zonamide was continued at 200 mg BID. Since these changes patient has reported no auras or seizures. Patient dose note excessive fatigue and day time somnolence. As per the patient, he states that he falls asleep at 2am each day and sleeps till roughly 9pm. However, his sleep is interrupted by his pet dog - who excessively barks at night. He wakes up roughly 2 times a night for 30-40 minutes. Patient subsequently naps for roughly 4-6 hours of the day. Of note, patient has a diagnosis of OSA and is compliant with CPAP every night. He also has been diagnosed with hypothyroidism and is complaint with his synthroid medication. As per patient PCP, was proposed that patient possibly start on Provigil given that patient has been compliant on CPAP and Thyroid levels are wnl.        REVIEW OF SYSTEMS:  A 5-system ROS was performed and is negative except for those items noted in the HPI and any additional items noted here: None    PAST MEDICAL & SURGICAL HISTORY:  Spondylolisthesis  Classic migranie  Dermatophytosis  Diverticulosis  Hyperplasia of prostate  Lumbar disk rupture  Major depressive disorder  Otitis  Panic disorder  Hearing loss  Bipolar    MEDICATIONS:  Current Outpatient Prescriptions on File Prior to Visit   Medication Sig Dispense Refill    Calcium-Cholecalciferol, D3, (OSCAL 500+D) 500 mg(1,250mg ) -200 unit per tablet Take 1 tablet by mouth 2 times daily with meals. 100 tablet 11    Clonazepam (KLONOPIN) 0.5 mg Disintegrating Tablet Disintegrating Tablet Take 1 tablet by mouth if needed (cluster of seizures). 20 tablet 0    Divalproex (DEPAKOTE) 250 mg Delayed Release Tablet Take 3 tablets by mouth 2 times daily. 90 tablet 5     LevETIRAcetam (KEPPRA) 250 mg Tablet Take 3 tablets by mouth 2 times daily. 60 tablet 11    Levocarnitine (CARNITOR) 330 mg Tablet Take 1 tablet by mouth 2 times daily. 60 tablet 11    LevoTHYROxine (SYNTHROID) 75 mcg Tablet Take 75 mcg by mouth every morning before a meal.      MULTIVITAMIN PO       NABUMETONE (RELAFEN PO) Take 750 mg by mouth 2 times daily.      Omeprazole (PRILOSEC) 20 mg Delayed Release Capsule Take 20 mg by mouth once daily before a meal.      Tamsulosin (FLOMAX) 0.4 mg Capsule Take 0.4 mg by mouth every day at bedtime.      VENLAFAXINE HCL (EFFEXOR PO) Take 150 mg by mouth every day at bedtime.      Zonisamide (ZONISAMIDE) 100 mg Capsule Take 2 capsules by mouth 2 times daily.       No current facility-administered medications on file prior to visit.       ALLERGIES:  Allergies   Allergen Reactions    Pcn (Penicillin) [Penicillins] Rash    Vicodin [Hydrocodone-Acetaminophen] Anxiety         Vitals:  Temp: 36.2 C (97.2 F) (07/19 1247)  Temp src: Tympanic (07/19 1247)  Pulse: 95 (07/19 1247)  BP: 141/82 mmHg (07/19 1247)  Resp: 20 (07/19 1247)  SpO2: --  Height: 172.7 cm (5\' 8" ) (07/19 1247)  Weight: 101.4 kg (223 lb 8.7 oz) (07/19 1247)    General Physical Exam:  GENERAL: NAD  HEENT: MMM. OP clear. No carotid bruits. Neck supple. Scar on the R from neck surgery. Scar on the R from VNS leads.  HEART: RRR, no murmurs/rubs/gallops  Chest: VNS scar on the L chest    Neurological Exam:  Mental status  State: Alert, oriented to self, place, time. Appropriate affect and behavior.  Use of language: Fluent output, comprehension intact, repetition intact    CRANIAL NERVES:  CN 2: Pupils 79mm->3mm, equal round reactive to light. Visual fields intact to confrontation testing bilaterally.   CN 3,4,6: EOM intact with 8-9 beats of horizontal nystagmus.  CN 5: Sensation to LT intact bilaterally. Sensation to pinprick intact bilaterally. Masseter strength full bilaterally.  CN 7: Symmetric smile,  brow raise, and eye closure. Eye closure strength full.  CN 8: Hearing symmetric to finger rub.  CN 9, 10: Palate elevation midline.  CN 11: SCM strength full bilaterally. Trapezius strength full bilaterally.  CN 12: Tongue protrusion midline.     Motor:  Bulk: No atrophy  Tone: Normal tone, no spasticity or rigidity  Fine tremors in the hands with arms outstretched  Pronator drift: none   Deltoid  Biceps  Triceps  BR  Wrist Flexors  Wrist Extensors  Hand Intrinsics  Grip    R  5/5  5/5  5/5  5/5  5/5  5/5  5/5  5/5    L  5/5  5/5  5/5  5/5  5/5  5/5  5/5  5/5     Hip Flexors  Hip Ext  Knee Flexors  Knee Ext  Dorsi- Flexion  Plantar- Flexion    R  5/5  5/5  5/5  5/5  5/5  5/5    L  5/5  5/5  5/5  5/5  5/5  5/5      Sensory:  Light touch: Intact in all extremities  Vibration: Intact in distal extremities  Temperature: Intact in all extremities    Reflexes:   Biceps  Triceps  BR  Patella    R  2+  2+  2+  2+    L  2+  2+  2+  2+      Coordination:  F-to-N: Intention tremors  H-to-S: Intact bilaterally    Gait/Stance:  Romberg Sign: positive  Gait: rises without difficulty, normal stable narrow based gait, symmetric steps, balance intact  Tandem gait: unable              DIAGNOSTIC STUDIES:    OSH EEG 12/2009 which was reportedly normal but no records are available. EEG 11/2011 was abnormal showing left hemispheric sharp and slow activity with phase reversal.    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    08/15/2014: This 5 day epilepsy monitoring unit admission captured 2 aura without EEG correlate and 5 seizures seizures arising over the right hemisphere. The patient's clinical semiology consisted of an difficult to describe aura then evolving to oral automatisms and hand automatisms. The patient was able to respond  to questions and follow simple commands during some of the seizures and other seizure or portions of seizures, he was not able to   respond or follow commands. The interictal recording is significant for infrequent spikes and more frequent intermittent rhythmic slowing over the right hemisphere, maximal over the frontal region.       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 Helene ShoeBirk presenting for seizure management. Patient was recently admitted for continuous 08/10/2014  EEG which showed that seizures arising from his left hemisphere. Upon discharge patient Depakote was decreased from 1000 BID to 750 BID secondary to ammonia levels of 90. His Keppra was then increased to 750 BID and zonisamide was continued at 200 mg BID. Patient is currently experiencing excessive somnolence and daytime sleepiness which may secondary to sleep apnea vs hypothyroidism vs medication side effects vs poor sleep hygiene. Patient currently complaint with CPAP at night and recent TSH within normal limits. Will investigate possible medication side effects given that patient    PLAN / RECOMMENDATIONS:   - Counseled patient on appropriate sleep hygiene  - Will check patient Ammonia Level  - Will increased patient Carnitine to 660 mg BID from 330 mg   - Will trial patient on Monafadil 100mg  qAM, if patient sees significant improvement but still has pm sleepiness will consider increase to BID dosing (morning and noon)  - Counsel patient on weight loss to improve OSA    Return to clinic in 3 month(s).    Patient was seen and discussed with attending Dr. Trilby DrummerSeyal  Sincerely,  Roylene Reason, MD  Resident  Department of Neurology    Report electronically signed by:  Roylene Reason, MD     Neurology Attending    This patient was seen, evaluated, and care plan was  developed with the resident. I agree with the findings and plan as outlined in the resident's note above. The total visit time today was 20 minutes and more than 50% of the time was spent in face to face counseling the patient regarding the diagnosis and future management, reviewing records and other issues as noted above.    Teodora Medici MD

## 2014-09-05 NOTE — Nursing Note (Signed)
Vital signs taken, allergies verified, screened for pain. Preferred pharmacy selected. Refills needed? YES.Arlenne Kimbley MA II

## 2014-09-06 ENCOUNTER — Telehealth: Payer: Self-pay | Admitting: NEUROLOGY

## 2014-09-06 NOTE — Telephone Encounter (Signed)
Patient called in stating pharmacy did not received medication Modafinil (PROVIGIL) 100 mg Tablet. Please refax medication to [facility].    Peter Deleon

## 2014-09-07 NOTE — Telephone Encounter (Signed)
Patient called to check status of message below.    Renee Cardinale  MOSC III, Neurology  [phone]

## 2014-09-07 NOTE — Telephone Encounter (Signed)
Prescription ordered for Provigil 100 mg called in to [facility] per pt request.    Larita Fife, RN  Neurology

## 2014-09-28 NOTE — Telephone Encounter (Signed)
Received phone call from patient yesterday 09/27/14, stating that his new medication Modafinil (PROVIGIL)  Tablet requires a prior authorization. Patient with [employer] Part D Plan [phone] and member ID# [id number]. Complete a PA Request Form from [employer] and fax it over with MD notes at [phone]. Received approval notification effective 06/29/2014 till 09/27/2015 from [facility] [employer] [employer] late eveing on 09/27/14. Patient has been inform this morning, also fax approval letter to patient [facility].    Mosie Epstein III  Neurology Dept

## 2014-10-18 ENCOUNTER — Telehealth: Payer: Self-pay | Admitting: NEUROLOGY

## 2014-10-18 DIAGNOSIS — R569 Unspecified convulsions: Principal | ICD-10-CM

## 2014-10-18 NOTE — Telephone Encounter (Signed)
Patient called in to update medication Modafinil (PROVIGIL) 100 mg Tablet, mimunal improvement. Patient would like to know if Dr.Seyal would like patient to continue medication or discontinue.    Helyn App

## 2014-10-19 NOTE — Telephone Encounter (Signed)
Please ask pt if he was dosing 1 tab daily or 2 tabs a day? Peter Deleon

## 2014-10-19 NOTE — Telephone Encounter (Signed)
Pt states he has been taking 1 tablet daily.  He would like to to try 2 tabs daily if all in agreement.    Lyda Perone Akaylah Lalley RN/neurology

## 2014-10-19 NOTE — Telephone Encounter (Signed)
Left message for pt to call back.  Please ask pt if he was taking 1 or 2 tabs of Modafinil daily when he calls back.    Lyda Perone Baylin Gamblin RN/neurology

## 2014-10-20 MED ORDER — MODAFINIL 100 MG TABLET
ORAL_TABLET | ORAL | 4 refills | Status: DC
Start: 2014-10-20 — End: 2015-03-22

## 2014-10-20 NOTE — Telephone Encounter (Signed)
OK for him to take 1 tab in AM and 1 tab at noon.  Have him call in a couple weeks to report on his alertness.  Thanks, Raynelle Fanning

## 2014-10-20 NOTE — Telephone Encounter (Signed)
Called pt and relayed message.  He is agreeable to plan.  Updated script faxed to his pharmacy.    Lyda Perone Deniel Mcquiston RN/neurology

## 2014-11-20 ENCOUNTER — Other Ambulatory Visit: Payer: Self-pay | Admitting: Student in an Organized Health Care Education/Training Program

## 2014-11-20 NOTE — Telephone Encounter (Signed)
Refill request for Zonegran 100 mg capsules from [facility] has pended for your review.  Last OV note describes pt as taking this medication, but plan does not indicate if pt is to continue and if so at what dose.  Please advise or approve for refill.  Thanks.    Larita Fife, RN  Neurology

## 2014-11-24 MED ORDER — ZONISAMIDE 100 MG CAPSULE
200.0000 mg | ORAL_CAPSULE | Freq: Two times a day (BID) | ORAL | 5 refills | Status: DC
Start: 2014-11-24 — End: 2015-03-29

## 2014-11-24 NOTE — Telephone Encounter (Signed)
Received phone call from patient, regards to refill for medication Zonegran . Patient only has one tablet let.  Please advise.    Helyn App

## 2014-11-24 NOTE — Telephone Encounter (Signed)
Reviewed last couple of office visit notes and hospital admission for VEEG.  This is a  REFILL AUTHORIZATION for the faxed request for Zonisamide prescribed by Dr. Trilby Drummer.  Medication refilled per Prescription Refill by Clinic RN Standardized Procedure Policy II-31.    Lyda Perone Charlet Harr RN/neurology

## 2015-01-29 ENCOUNTER — Telehealth: Payer: Self-pay | Admitting: NEUROLOGY

## 2015-01-29 MED ORDER — DIVALPROEX 250 MG TABLET,DELAYED RELEASE
750.0000 mg | DELAYED_RELEASE_TABLET | Freq: Two times a day (BID) | ORAL | 5 refills | Status: DC
Start: 2015-01-29 — End: 2015-03-29

## 2015-01-29 NOTE — Telephone Encounter (Signed)
This is a  REFILL AUTHORIZATION for the patient refill request for Depakote prescribed by Dr. Trilby DrummerSeyal.  Medication refilled per Prescription Refill by Clinic RN Standardized Procedure Policy II-31.    Lyda PeroneKirk Dick Hark RN/neurology

## 2015-01-29 NOTE — Telephone Encounter (Signed)
Patient called in to check status on refill for medication Divalproex (DEPAKOTE) 250 mg Delayed Release Tablet. Patient currently all out of medication.  Please advise.    Helyn Appharisa Jaramillo  [facility] [place] - [id number]

## 2015-03-01 ENCOUNTER — Encounter: Payer: Self-pay | Admitting: NEUROLOGY

## 2015-03-02 ENCOUNTER — Ambulatory Visit: Payer: Medicare Other

## 2015-03-02 ENCOUNTER — Ambulatory Visit: Payer: Medicare Other | Attending: NEUROLOGY | Admitting: NEUROLOGY

## 2015-03-02 VITALS — BP 145/98 | HR 99 | Temp 96.9°F | Resp 16 | Ht 68.0 in | Wt 228.2 lb

## 2015-03-02 DIAGNOSIS — G40109 Localization-related (focal) (partial) symptomatic epilepsy and epileptic syndromes with simple partial seizures, not intractable, without status epilepticus: Principal | ICD-10-CM | POA: Insufficient documentation

## 2015-03-02 DIAGNOSIS — Z72821 Inadequate sleep hygiene: Secondary | ICD-10-CM | POA: Insufficient documentation

## 2015-03-02 LAB — CBC WITH DIFFERENTIAL
BASOPHILS % AUTO: 0.6 %
BASOPHILS ABS AUTO: 0 10*3/uL (ref 0–0.2)
EOSINOPHIL % AUTO: 2 %
EOSINOPHIL ABS AUTO: 0.2 10*3/uL (ref 0–0.5)
HEMATOCRIT: 44.9 % (ref 41–53)
HEMOGLOBIN: 15.2 g/dL (ref 13.5–17.5)
LYMPHOCYTE ABS AUTO: 2.3 10*3/uL (ref 1.0–4.8)
LYMPHOCYTES % AUTO: 28.2 %
MCH: 30.1 pg (ref 27–33)
MCHC: 33.8 % (ref 32–36)
MCV: 88.9 UM3 (ref 80–100)
MONOCYTES % AUTO: 9 %
MONOCYTES ABS AUTO: 0.7 10*3/uL (ref 0.1–0.8)
MPV: 9.2 UM3 (ref 6.8–10.0)
NEUTROPHIL ABS AUTO: 4.9 10*3/uL (ref 1.80–7.70)
NEUTROPHILS % AUTO: 60.2 %
PLATELET COUNT: 202 10*3/uL (ref 130–400)
RDW: 14.1 U (ref 0–14.7)
RED CELL COUNT: 5.05 10*6/uL (ref 4.5–5.9)
WHITE BLOOD CELL COUNT: 8.1 10*3/uL (ref 4.5–11.0)

## 2015-03-02 LAB — COMPREHENSIVE METABOLIC PANEL
ALANINE TRANSFERASE (ALT): 47 U/L (ref 6–63)
ALBUMIN: 3.9 g/dL (ref 3.4–4.8)
ALKALINE PHOSPHATASE (ALP): 76 U/L (ref 35–115)
ASPARTATE TRANSAMINASE (AST): 27 U/L (ref 15–43)
BILIRUBIN TOTAL: 0.5 mg/dL (ref 0.3–1.3)
CALCIUM: 9.1 mg/dL (ref 8.6–10.5)
CARBON DIOXIDE TOTAL: 23 meq/L — AB (ref 24–32)
CHLORIDE: 105 meq/L (ref 95–110)
CREATININE BLOOD: 0.86 mg/dL (ref 0.44–1.27)
GLUCOSE: 86 mg/dL (ref 70–99)
POTASSIUM: 4.1 meq/L (ref 3.3–5.0)
PROTEIN: 6.6 g/dL (ref 6.3–8.3)
SODIUM: 137 meq/L (ref 135–145)
UREA NITROGEN, BLOOD (BUN): 11 mg/dL (ref 8–22)

## 2015-03-02 LAB — AMMONIA: AMMONIA: 86 umol/L — AB (ref 2–30)

## 2015-03-02 LAB — VALPROATE: VALPROATE: 40 mg/L — AB (ref 50–100)

## 2015-03-02 NOTE — Progress Notes (Addendum)
PATIENT:  Peter Deleon  MRN:         [id number]  DATE:        03/02/2015    Peter Deleon, was seen today at the [facility] in follow-up for localization related epilepsy of temporal onset.    To recall, he had been previously diagnosed with left temporal epilepsy by an outside institution after EEGs there showed slowing and sharp activity with "phase reversals" over the left temporal region. 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 He was subsequently admitted for VET monitoring here in May 2016 to attempt to lateralized his seizures. In the hospital he had several auras that were not associated with EEG changes and one typical seizure that electrographically was of right temporal onset.    He has not had any seizures since his VET admission in May 2016 and subsequent medication adjustments.    He is interested in decreasing his VPA somewhat due to daytime somnolence.    Seizure Types:  Type 1) Difficult to described aura )maybe fear/anxiety) followed by decreased responsiveness with automatisms of mouth and hands.    He has not had a convulsion since he was about 84 months old.    Current AEDs  Depakote 250mg  3 tabs BID  Zonisamide 200mg  BID  Keppra 750mg  BID    Lorazepam 0.5mg  prn    Takes carnitor for hyperammonemia  Takes modafinil for daytime sleepiness.    Uses a CPAP which helps with sleepiness but sometimes forgets to wear it.    He goes to bed around 2am, and wakes up at 7am (becuase of his dog) and then goes back to sleep until 9-10am    MEDICATIONS:  Current Outpatient Prescriptions on File Prior to Visit   Medication Sig Dispense Refill    Divalproex (DEPAKOTE) 250 mg Delayed Release Tablet Take 3 tablets by mouth 2 times daily. 180 tablet 5    LevETIRAcetam (KEPPRA) 250 mg Tablet Take 3 tablets by mouth 2 times daily. 60 tablet 11    Levocarnitine (CARNITOR) 330 mg Tablet Take 2 tablets by mouth 2 times daily with  meals. 60 tablet 11    LevoTHYROxine (SYNTHROID) 75 mcg Tablet Take 75 mcg by mouth every morning before a meal.      Modafinil (PROVIGIL) 100 mg Tablet Take 1 tablet in AM and 1 tablet at noon 60 tablet 4    MULTIVITAMIN PO       NABUMETONE (RELAFEN PO) Take 750 mg by mouth 2 times daily.      Omeprazole (PRILOSEC) 20 mg Delayed Release Capsule Take 20 mg by mouth once daily before a meal.      Tamsulosin (FLOMAX) 0.4 mg Capsule Take 0.4 mg by mouth every day at bedtime.      VENLAFAXINE HCL (EFFEXOR PO) Take 150 mg by mouth every day at bedtime.      Zonisamide (ZONISAMIDE) 100 mg Capsule Take 2 capsules by mouth 2 times daily. 120 capsule 5     No current facility-administered medications on file prior to visit.        EXAMINATION:    Vitals:  Temp: 36.1 C (96.9 F) (01/13 1152)  Temp src: Tympanic (01/13 1152)  Pulse: 99 (01/13 1154)  BP: 145/98 (01/13 1154)  Resp: 16 (01/13 1152)  SpO2: --  Height: 172.7 cm (5\' 8" ) (01/13 1152)  Weight: 103.5 kg (228 lb 2.8 oz) (01/13 1152)    GEN: Awake  and alert  NEURO: speech fluent, no dysarthria, follows commands  PERRL, EOMI, Visual fields full, Face symmetric, symmetric facial sensation  Normal tone/bulk. Power 5/5  DTRs 2+ throughout  Symmetric sensation to light touch in arms/leg bilaterally  No finger to nose dysmetria. 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    PLAN / RECOMMENDATIONS:   - Continue with current AED regimen of:   -Depakote 250mg  3 tabs BID   Zonisamide 200mg  BID   Keppra 750mg  BID   Lorazepam 0.5mg  prn   VNS  - Check VPA levels, CBC, CMP, NH3 to monitor for  side effects  - If VPA is therapeutic, may consider decreasing his dose slightly to see if he has an improvement in his daytime sleepiness. However after discussing his sleep habits, it seems his symptoms may be related to poor sleep hygiene and non-compliance with his CPAP.     Return to clinic in  6 months or sooner if needed.    Patient was seen and discussed with attending Dr. Trilby DrummerSeyal who agrees with the above stated findings and plan of care.     Sincerely,  Harlow OhmsJessica Brooksie Ellwanger, DO  Fellow  Department of Neurology    Report electronically signed by:  Harlow OhmsJessica Clif Serio, DO    Neurology Attending    This patient was seen, evaluated, and care plan was developed with the resident. I agree with the findings and plan as outlined in the resident's note above. The total visit time today was 20 minutes and more than 50% of the time was spent in face to face counseling the patient regarding the diagnosis and future management, reviewing records and other issues as noted above.    Teodora MediciMasud Seyal MD

## 2015-03-02 NOTE — Nursing Note (Signed)
Vital signs taken, allergies verified, screened for pain. Preferred pharmacy selected. Refills needed? No.Betsi Crespi MA II

## 2015-03-05 ENCOUNTER — Encounter: Payer: Self-pay | Admitting: NEUROLOGY

## 2015-03-07 ENCOUNTER — Encounter: Payer: Self-pay | Admitting: NURSE PRACTITIONER

## 2015-03-22 ENCOUNTER — Other Ambulatory Visit: Payer: Self-pay | Admitting: NURSE PRACTITIONER

## 2015-03-22 DIAGNOSIS — R569 Unspecified convulsions: Principal | ICD-10-CM

## 2015-03-27 ENCOUNTER — Telehealth: Payer: Self-pay | Admitting: NEUROLOGY

## 2015-03-27 NOTE — Telephone Encounter (Signed)
Received phone call from patient, spouse stating patient is having problem speaking.Patient starts to choke from his VNS.  Please advise.    Peter Deleon  [facility]

## 2015-03-27 NOTE — Telephone Encounter (Signed)
Spoke with pt's wife Myra.  She states that for the past three days, pt is having choking spasms whenever the VNS goes off.  Raynelle Fanning can see pt today or Thursday.  Myra states they can make it here Thursday.  In the meantime, if not tolerable pt may tape VNS magnet over VNS site to prevent it from going off.  She verbalizes understanding.    Lyda Perone Eldra Word RN/neurology

## 2015-03-29 ENCOUNTER — Ambulatory Visit: Payer: Medicare Other | Attending: NURSE PRACTITIONER | Admitting: NURSE PRACTITIONER

## 2015-03-29 ENCOUNTER — Encounter: Payer: Self-pay | Admitting: NURSE PRACTITIONER

## 2015-03-29 VITALS — BP 123/81 | HR 85 | Temp 97.8°F | Resp 20 | Ht 68.0 in | Wt 223.3 lb

## 2015-03-29 DIAGNOSIS — Z79899 Other long term (current) drug therapy: Secondary | ICD-10-CM | POA: Insufficient documentation

## 2015-03-29 DIAGNOSIS — R569 Unspecified convulsions: Principal | ICD-10-CM | POA: Insufficient documentation

## 2015-03-29 DIAGNOSIS — G40919 Epilepsy, unspecified, intractable, without status epilepticus: Secondary | ICD-10-CM

## 2015-03-29 MED ORDER — MODAFINIL 100 MG TABLET
100.0000 mg | ORAL_TABLET | Freq: Two times a day (BID) | ORAL | 1 refills | Status: DC
Start: 2015-03-29 — End: 2015-10-25

## 2015-03-29 MED ORDER — DIVALPROEX ER 500 MG TABLET,EXTENDED RELEASE 24 HR
4.0000 | EXTENDED_RELEASE_TABLET | Freq: Every day | ORAL | 4 refills | Status: DC
Start: 2015-03-29 — End: 2015-08-23

## 2015-03-29 MED ORDER — ZONISAMIDE 100 MG CAPSULE
400.0000 mg | ORAL_CAPSULE | Freq: Every day | ORAL | 4 refills | Status: DC
Start: 2015-03-29 — End: 2015-08-23

## 2015-03-29 MED ORDER — LEVETIRACETAM ER 750 MG TABLET,EXTENDED RELEASE 24 HR
1500.0000 mg | EXTENDED_RELEASE_TABLET | Freq: Every day | ORAL | 4 refills | Status: DC
Start: 2015-03-29 — End: 2015-08-23

## 2015-03-29 NOTE — Nursing Note (Signed)
Vital signs taken, allergies verified, screened for pain. Preferred pharmacy selected. Refills needed? Yes.Shamon Cothran MA II

## 2015-04-11 NOTE — Progress Notes (Signed)
NEUROSCIENCES CLINIC FOLLOW UP NOTE  DEPARTMENT OF NEUROLOGY    Patient: Peter Deleon  MRN: [id number]  Sex: male  Age: 55 yr  DOB: [birth year]    DATE OF VISIT: 03/29/2015     CHIEF COMPLAINT: Choking Spasms    I saw Kaisyn and his wife Myra for follow up in the [facility] today.  The patient was last seen on 03/02/15 by Dr. Trilby Drummer.  However, since his last visit he called to report "choking spasms" in his throat when the VNS device would activate.  Hence, he came to clinic for evaluation of his VNS device.    He reports the choking spasms began 2 weeks ago.  At that time he had an upper respiratory infection which resolved.  He feels his voice is "hoarse" with VNS activation.  He also says it feels like he has "laryngitis" when it is activating.  This has never happened before.  Several weeks ago he tripped on some shoes and fell in the living room of his home.  He was recovering for surgery on his left hand so was unable to stop from falling.  The "choking spasms" started about 1 wk after the fall.      He has been seizure free since his last visit.  He takes Depakote SR 1000 mg BID, zonisamide 200 mg BID, and levetiracetam 750 mg BID.  He has marked daytime sedation so takes Provigil 100 mg in AM and at noon which helps him sleep less.     He was prescribed Carnitor for elevated ammonia level. This makes his breath have a fishy odor and does not like to take it.    ALLERGIES: Pcn (penicillin) [penicillins] and Vicodin [hydrocodone-acetaminophen]    CURRENT MEDICATIONS:   Current Outpatient Prescriptions   Medication Sig    Divalproex (DEPAKOTE ER) 500 mg Extended Release Tablet Take 4 tablets by mouth every day at bedtime.    LevETIRAcetam (KEPPRA XR) 750 mg SR 24hr Tablet Take 2 tablets by mouth every day.    Levocarnitine (CARNITOR) 330 mg Tablet Take 2 tablets by mouth 2 times daily with meals.    LevoTHYROxine (SYNTHROID) 75 mcg Tablet Take 75 mcg by mouth every morning before a meal.    Modafinil  (PROVIGIL) 100 mg Tablet Take 1 tablet by mouth 2 times daily.    MULTIVITAMIN PO     NABUMETONE (RELAFEN PO) Take 750 mg by mouth 2 times daily.    Omeprazole (PRILOSEC) 20 mg Delayed Release Capsule Take 20 mg by mouth once daily before a meal.    Tamsulosin (FLOMAX) 0.4 mg Capsule Take 0.4 mg by mouth every day at bedtime.    VENLAFAXINE HCL (EFFEXOR PO) Take 150 mg by mouth every day at bedtime.    Zonisamide (ZONISAMIDE) 100 mg Capsule Take 4 capsules by mouth every day at bedtime.     No current facility-administered medications for this visit.        PAST MEDICAL HISTORY: No past medical history on file.    REVIEW OF SYSTEMS: All ROS except as noted above are negative.    OBJECTIVE:   VITALS: BP 123/81  Pulse 85  Temp 36.6 C (97.8 F) (Tympanic)  Resp 20  Ht 1.727 m ( )  Wt 101.3 kg (223 lb 5.2 oz)  BMI 33.96 kg/m2    GENERAL: Obese male, NAD    HEENT:  Oropharynx clear.  Left anterior neck sl tender to palpation over site of lead attachment. No erythema, edema.  NEUROLOGIC EXAM:     Mental Status:  Alert.  Fluent Speech when VNS not activating, hoarse when activating.  Oriented to person, place, time and situation.    Coordination:  Gait normal.    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   Gar Gibbon, MD    VideoEEG 07/10/14-07/14/14 IMPRESSION:  This 5 day epilepsy monitoring unit admission captured 2 aura   without EEG correlate and 5 seizures seizures arising over the   right hemisphere. The patient's clinical semiology consisted of   an difficult to describe aura then evolving to oral automatisms   and hand automatisms. The patient was able to respond to   questions and follow simple commands during some of the seizures   and other seizure or portions of seizures, he was not able to   respond or follow commands. The  interictal recording is   significant for infrequent spikes and more frequent intermittent   rhythmic slowing over the right hemisphere, maximal over the   frontal region.  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  However, he has marked daytime sedation, likely due to his elevated ammonia. He will resume Carnitor 330 mg TID.  We will also transition him to zonisamide 400 mg at HS, Depakote ER 2000 mg at HS, and levetiracetam XR 1500 mg at HS.     2) VNS lead tender to palpation in left anterior neck, probable irritation from recent fall. He will take ibuprofen 400 mg TID with meals. The settings were reduced and he tolerated the activation without discomfort.    3) We will see this patient in 2 months, sooner should there be any concerns.    A total of 30 minutes were spent with this patient, over half in counseling regarding seizure and medication management.     Dr. Trilby Drummer is the supervising physician.    Sincerely,    Loreen Freud, MSN, FNP-BC, GNP-BC, ANP  Department of Neurology   PI # [phone]

## 2015-05-31 ENCOUNTER — Ambulatory Visit: Payer: Medicare Other | Attending: NURSE PRACTITIONER | Admitting: NURSE PRACTITIONER

## 2015-05-31 ENCOUNTER — Encounter: Payer: Self-pay | Admitting: NURSE PRACTITIONER

## 2015-05-31 VITALS — BP 124/82 | HR 99 | Temp 98.1°F | Resp 18 | Ht 68.0 in | Wt 228.4 lb

## 2015-05-31 DIAGNOSIS — G40209 Localization-related (focal) (partial) symptomatic epilepsy and epileptic syndromes with complex partial seizures, not intractable, without status epilepticus: Principal | ICD-10-CM | POA: Insufficient documentation

## 2015-05-31 DIAGNOSIS — G40109 Localization-related (focal) (partial) symptomatic epilepsy and epileptic syndromes with simple partial seizures, not intractable, without status epilepticus: Secondary | ICD-10-CM

## 2015-05-31 DIAGNOSIS — Z462 Encounter for fitting and adjustment of other devices related to nervous system and special senses: Secondary | ICD-10-CM | POA: Insufficient documentation

## 2015-05-31 NOTE — Nursing Note (Signed)
Vital signs taken, allergies verified, screened for pain.     Zakirah Weingart, Avila Barron MA

## 2015-06-06 NOTE — Progress Notes (Signed)
NEUROSCIENCES CLINIC FOLLOW UP NOTE  DEPARTMENT OF NEUROLOGY    Patient: Peter Deleon  MRN: [id number]  Sex: male  Age: 55 yr  DOB: Sep 28, 1960    DATE OF VISIT: 05/31/2015     CHIEF COMPLAINT: Choking Spasms    I saw Peter Deleon and his wife Peter Deleon for follow up in the [facility] today.  The patient was last seen on 03/29/2015.       At his last visit he was reporting 2 weeks of "choking spasms" in his throat when the VNS device would activate.  One week prior to the "choking spasms" he tripped on some shoes and fell in the living room of his home.  He was recovering for surgery on his left hand so was unable to stop from falling.  At his last visit his VNS settings were reduced for the duty cycle.  This resolved the symptoms immediately. However, he continues to have excessive coughing when he uses the magnet for activation of the VNS device.    He may have had 1 seizure in his sleep since his last visit.  At his last visit his doses were changed as follows: Depakote SR 1000 mg BID was changed to Depakote ER 2000 mg at HS, zonisamide 200 mg BID was changed to 400 mg at HS, and levetiracetam 750 mg BID was changed to levetiracetam ER 1500 mg at HS. He feels so much better on his current dosage formulations as he is less sedated and it is much easier to take his meds.  He has marked daytime sedation so takes Provigil 100 mg in AM and at noon which helps him sleep less.     He was prescribed Carnitor for elevated ammonia level. This causes halitosis so he does not take it regularly.    ALLERGIES: Pcn (penicillin) [penicillins] and Vicodin [hydrocodone-acetaminophen]    CURRENT MEDICATIONS:   Current Outpatient Prescriptions   Medication Sig    Divalproex (DEPAKOTE ER) 500 mg Extended Release Tablet Take 4 tablets by mouth every day at bedtime.    LevETIRAcetam (KEPPRA XR) 750 mg SR 24hr Tablet Take 2 tablets by mouth every day.    Levocarnitine (CARNITOR) 330 mg Tablet Take 2 tablets by mouth 2 times daily with meals.     LevoTHYROxine (SYNTHROID) 75 mcg Tablet Take 75 mcg by mouth every morning before a meal.    Modafinil (PROVIGIL) 100 mg Tablet Take 1 tablet by mouth 2 times daily.    MULTIVITAMIN PO     NABUMETONE (RELAFEN PO) Take 750 mg by mouth 2 times daily.    Omeprazole (PRILOSEC) 20 mg Delayed Release Capsule Take 20 mg by mouth once daily before a meal.    Tamsulosin (FLOMAX) 0.4 mg Capsule Take 0.4 mg by mouth every day at bedtime.    VENLAFAXINE HCL (EFFEXOR PO) Take 150 mg by mouth every day at bedtime.    Zonisamide (ZONISAMIDE) 100 mg Capsule Take 4 capsules by mouth every day at bedtime.     No current facility-administered medications for this visit.        PAST MEDICAL HISTORY: No past medical history on file.    REVIEW OF SYSTEMS: All ROS except as noted above are negative.    OBJECTIVE:   VITALS: BP 124/82  Pulse 99  Temp 36.7 C (98.1 F) (Tympanic)  Resp 18  Ht 1.727 m ( )  Wt 103.6 kg (228 lb 6.4 oz)  BMI 34.73 kg/m2    GENERAL: Obese male, NAD  NEUROLOGIC EXAM:     Mental Status:  Alert.  Fluent Speech when VNS activating.  Oriented to person, place, time and situation.    Coordination:  Gait normal.    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   Gar GibbonArthur Dublin, MD    VideoEEG 07/10/14-07/14/14 IMPRESSION:  This 5 day epilepsy monitoring unit admission captured 2 aura   without EEG correlate and 5 seizures seizures arising over the   right hemisphere. The patient's clinical semiology consisted of   an difficult to describe aura then evolving to oral automatisms   and hand automatisms. The patient was able to respond to   questions and follow simple commands during some of the seizures   and other seizure or portions of seizures, he was not able to   respond or follow commands. The interictal recording is   significant for  infrequent spikes and more frequent intermittent   rhythmic slowing over the right hemisphere, maximal over the   frontal region.  Adonis HugueninJeff Kennedy, MD    Vagal Nerve Stimulator Settings:  0.25 output current      30 signal frequency  250 pulse width  30 on time (sec)  5.0 off time (min)  0.75 Magnet output current   Reduced to 0.25, pt tol well  250 Magnet pulse width  30 Magnet on time  no IRI    ASSESSMENT/PLAN:     1)  Right hemispheric epilepsy.  Reduced daytime sedation after change in dosing intervals.  He will continue zonisamide 400 mg at HS, Depakote ER 2000 mg at HS, and levetiracetam XR 1500 mg at HS.     2) VNS magnet output current reduced to 0.25 which the patient tolerated well.    3) We will see this patient in 6 months, sooner should there be any concerns.    A total of 30 minutes were spent with this patient, 5 min adjusting VNS device and 10 min counseling regarding seizure and medication management.     Dr. Trilby DrummerSeyal is the supervising physician.    Sincerely,    Loreen FreudJulie A. Jaxin Fulfer, MSN, FNP-BC, GNP-BC, ANP  Department of Neurology   PI # [id number]

## 2015-06-18 ENCOUNTER — Emergency Department (HOSPITAL_BASED_OUTPATIENT_CLINIC_OR_DEPARTMENT_OTHER)
Admission: EM | Admit: 2015-06-18 | Discharge: 2015-06-19 | Disposition: A | Discharge status: Home / Self Care | Payer: Medicare Other | Source: Home / Self Care | Attending: Emergency Medicine | Admitting: Emergency Medicine

## 2015-06-18 ENCOUNTER — Encounter (HOSPITAL_COMMUNITY): Payer: Self-pay | Admitting: Emergency Medicine

## 2015-06-18 DIAGNOSIS — G40901 Epilepsy, unspecified, not intractable, with status epilepticus: Secondary | ICD-10-CM | POA: Diagnosis not present

## 2015-06-18 DIAGNOSIS — Z880 Allergy status to penicillin: Secondary | ICD-10-CM

## 2015-06-18 DIAGNOSIS — G40909 Epilepsy, unspecified, not intractable, without status epilepticus: Secondary | ICD-10-CM | POA: Insufficient documentation

## 2015-06-18 DIAGNOSIS — Z79899 Other long term (current) drug therapy: Secondary | ICD-10-CM

## 2015-06-18 DIAGNOSIS — R569 Unspecified convulsions: Secondary | ICD-10-CM

## 2015-06-18 DIAGNOSIS — E039 Hypothyroidism, unspecified: Secondary | ICD-10-CM | POA: Diagnosis not present

## 2015-06-18 DIAGNOSIS — I10 Essential (primary) hypertension: Secondary | ICD-10-CM | POA: Diagnosis not present

## 2015-06-18 DIAGNOSIS — K219 Gastro-esophageal reflux disease without esophagitis: Secondary | ICD-10-CM

## 2015-06-18 DIAGNOSIS — E079 Disorder of thyroid, unspecified: Secondary | ICD-10-CM

## 2015-06-18 DIAGNOSIS — N400 Enlarged prostate without lower urinary tract symptoms: Secondary | ICD-10-CM | POA: Diagnosis not present

## 2015-06-18 DIAGNOSIS — F319 Bipolar disorder, unspecified: Secondary | ICD-10-CM | POA: Insufficient documentation

## 2015-06-18 HISTORY — DX: Unspecified convulsions: R56.9

## 2015-06-18 HISTORY — DX: Bipolar disorder, unspecified: F31.9

## 2015-06-18 HISTORY — DX: Gastro-esophageal reflux disease without esophagitis: K21.9

## 2015-06-18 HISTORY — DX: Disorder of thyroid, unspecified: E07.9

## 2015-06-18 HISTORY — DX: Essential (primary) hypertension: I10

## 2015-06-18 LAB — VALPROIC ACID LEVEL: VALPROIC ACID LVL: 63 ug/mL (ref 50.0–100.0)

## 2015-06-18 LAB — BASIC METABOLIC PANEL
ANION GAP: 11 (ref 5–15)
BUN: 12 mg/dL (ref 6–20)
CALCIUM: 9.4 mg/dL (ref 8.9–10.3)
CO2: 22 mmol/L (ref 22–32)
Chloride: 106 mmol/L (ref 101–111)
Creatinine, Ser: 0.94 mg/dL (ref 0.61–1.24)
Glucose, Bld: 98 mg/dL (ref 65–99)
Potassium: 4 mmol/L (ref 3.5–5.1)
Sodium: 139 mmol/L (ref 135–145)

## 2015-06-18 LAB — CBC
HCT: 42.9 % (ref 39.0–52.0)
HEMOGLOBIN: 14.5 g/dL (ref 13.0–17.0)
MCH: 30.7 pg (ref 26.0–34.0)
MCHC: 33.8 g/dL (ref 30.0–36.0)
MCV: 90.7 fL (ref 78.0–100.0)
Platelets: 183 10*3/uL (ref 150–400)
RBC: 4.73 MIL/uL (ref 4.22–5.81)
RDW: 12.9 % (ref 11.5–15.5)
WBC: 8.4 10*3/uL (ref 4.0–10.5)

## 2015-06-18 MED ORDER — DIVALPROEX SODIUM ER 500 MG PO TB24: 2000.0000 mg | ORAL_TABLET | Freq: Every day | ORAL | Status: DC

## 2015-06-18 MED ORDER — DIVALPROEX SODIUM ER 500 MG PO TB24
2000.0000 mg | ORAL_TABLET | Freq: Once | ORAL | Status: AC
  Administered 2015-06-19: 2000 mg via ORAL
  Filled 2015-06-18: qty 4

## 2015-06-18 MED ORDER — CLONAZEPAM 0.5 MG PO TABS: 1.0000 mg | ORAL_TABLET | Freq: Every day | ORAL | Status: DC

## 2015-06-18 MED ORDER — LORAZEPAM 2 MG/ML IJ SOLN
1.0000 mg | Freq: Once | INTRAMUSCULAR | Status: AC
  Administered 2015-06-18: 1 mg via INTRAVENOUS
  Filled 2015-06-18: qty 1

## 2015-06-18 MED ORDER — LEVETIRACETAM ER 500 MG PO TB24
1500.0000 mg | ORAL_TABLET | Freq: Once | ORAL | Status: AC
  Administered 2015-06-19: 1500 mg via ORAL
  Filled 2015-06-18: qty 3

## 2015-06-18 MED ORDER — LEVETIRACETAM ER 500 MG PO TB24: 1500.0000 mg | ORAL_TABLET | Freq: Every day | ORAL | Status: DC

## 2015-06-18 MED ORDER — CLONAZEPAM 1 MG PO TABS: 1.0000 mg | ORAL_TABLET | Freq: Two times a day (BID) | ORAL | Status: DC

## 2015-06-18 MED ORDER — ZONISAMIDE 100 MG PO CAPS: 400.0000 mg | ORAL_CAPSULE | Freq: Every day | ORAL | Status: DC

## 2015-06-18 MED ORDER — ZONISAMIDE 100 MG PO CAPS
400.0000 mg | ORAL_CAPSULE | Freq: Once | ORAL | Status: AC
  Administered 2015-06-19: 400 mg via ORAL
  Filled 2015-06-18: qty 4

## 2015-06-18 NOTE — ED Notes (Signed)
Pt states over the last week he has been having a few seizures a day. Pt states his seizures are sometimes absence or can been him yelling or having tremors. Pt is alert and ox4 in triage. Pt having no seizure like activity at present time. Pt denies any recent change in his meds. Pt states he usually goes a month at a time with no seizures.

## 2015-06-18 NOTE — ED Notes (Signed)
Pt wife came to desk and stated patient was having a seizure- upon arrival to patient, he was sitting in chair, alert, looking around, inappropriate movements noted to his hands, pt did not follow commands or answer questions. This lasted about 20 seconds, pt began to answer questions and follow commands. Remains alert, airway intact. Acuity adjusted, VS re-assessed.

## 2015-06-18 NOTE — ED Notes (Signed)
Dr. Kirkpatrick at bedside 

## 2015-06-18 NOTE — ED Provider Notes (Signed)
CSN: [id number]     Arrival date & time 06/18/15  2018 History   First MD Initiated Contact with Patient 06/18/15 2236     Chief Complaint  Patient presents with  . Seizures     (Consider location/radiation/quality/duration/timing/severity/associated sxs/prior Treatment) HPI Comments: Patient with a longstanding history of seizures presents with complaint of multiple seizures over the last 4 days. Wife reports 30 seizures in 4 days, with approximately 7 seizures today. Seizures are per his usual complex partial seizures described as absence, seizure active or yelling/screaming out. He has a short post-ictal period before returning to baseline. Seizures lasting 1-3 minutes. No injury during the last 4 days. He is currently taking Keppra 1500 mg at night, depakote 2000 mg at night and Zonegran 400 mg at night. Last dose change was February 2017. No recent illness, sleep deprivation although there has been sleep pattern disturbance while traveling from [place] to the [address]. He reports compliance with all medications.   Patient is a 55 y.o. male presenting with seizures. The history is provided by the patient. No language interpreter was used.  Seizures Seizure activity on arrival: yes   Seizure type:  Partial complex Preceding symptoms: aura   Return to baseline: yes     Past Medical History  Diagnosis Date  . Seizures (HCC)   . Hypertension   . Bipolar 1 disorder (HCC)   . Thyroid disease   . GERD (gastroesophageal reflux disease)    Past Surgical History  Procedure Laterality Date  . Back surgery     No family history on file. Social History  Substance Use Topics  . Smoking status: Never Smoker   . Smokeless tobacco: None  . Alcohol Use: No    Review of Systems  Constitutional: Negative for fever and chills.  HENT: Negative.   Respiratory: Negative.   Cardiovascular: Negative.   Gastrointestinal: Negative.   Musculoskeletal: Negative.   Skin: Negative.    Neurological: Positive for seizures. Negative for weakness.      Allergies  Hydrocodone and Penicillins  Home Medications   Prior to Admission medications   Medication Sig Start Date End Date Taking? Authorizing Provider  divalproex (DEPAKOTE ER) 500 MG 24 hr tablet Take 2,000 mg by mouth daily. 03/30/15  Yes Historical Provider, MD  levOCARNitine (CARNITOR) 330 MG tablet Take 330 mg by mouth 2 (two) times daily.   Yes Historical Provider, MD  Multiple Vitamin (MULTIVITAMIN WITH MINERALS) TABS tablet Take 1 tablet by mouth at bedtime.   Yes Historical Provider, MD  amLODipine (NORVASC) 2.5 MG tablet Take 2.5 mg by mouth at bedtime. 06/06/15   Historical Provider, MD  ARIPiprazole (ABILIFY) 2 MG tablet Take 2 mg by mouth at bedtime. 04/09/15   Historical Provider, MD  Levetiracetam 750 MG TB24 Take 1,500 mg by mouth at bedtime. 03/30/15   Historical Provider, MD  levothyroxine (SYNTHROID, LEVOTHROID) 75 MCG tablet Take 75 mcg by mouth daily. 06/06/15   Historical Provider, MD  modafinil (PROVIGIL) 100 MG tablet Take 100 mg by mouth See admin instructions. Take 1 tablet (100 mg) by mouth every morning and 1 tablet at noon 05/21/15   Historical Provider, MD  nabumetone (RELAFEN) 750 MG tablet Take 750 mg by mouth 2 (two) times daily. 05/21/15   Historical Provider, MD  omeprazole (PRILOSEC) 20 MG capsule Take 20 mg by mouth daily before breakfast. 06/06/15   Historical Provider, MD  tamsulosin (FLOMAX) 0.4 MG CAPS capsule Take 0.4 mg by mouth daily after supper. 05/24/15  Historical Provider, MD  tobramycin (TOBREX) 0.3 % ophthalmic solution Place 1 drop into both eyes every 6 (six) hours. 06/15/15   Historical Provider, MD  venlafaxine XR (EFFEXOR-XR) 150 MG 24 hr capsule Take 150 mg by mouth at bedtime. 03/19/15   Historical Provider, MD  zonisamide (ZONEGRAN) 100 MG capsule Take 400 mg by mouth at bedtime. 04/16/15   Historical Provider, MD   BP 154/94 mmHg  Pulse 97  Temp(Src) 98.4 F (36.9 C)  (Oral)  Resp 20  Ht 5\' 8"  (1.727 m)  Wt 102.059 kg  BMI 34.22 kg/m2  SpO2 100% Physical Exam  Constitutional: He is oriented to person, place, and time. He appears well-developed and well-nourished.  HENT:  Head: Normocephalic and atraumatic.  Eyes: EOM are normal. Pupils are equal, round, and reactive to light.  Neck: Normal range of motion.  Cardiovascular: Normal rate and regular rhythm.   No murmur heard. Pulmonary/Chest: Effort normal and breath sounds normal. He has no wheezes. He has no rales.  Abdominal: Soft. There is no tenderness.  Neurological: He is alert and oriented to person, place, and time. He has normal strength and normal reflexes. No sensory deficit. He displays a negative Romberg sign. Coordination normal.  CN's 3-12 grossly intact. No deficits of coordination. No lateralizing weakness or strength deficits. Speech clear, focused and oriented.   Skin: Skin is warm and dry.  Psychiatric: He has a normal mood and affect.    ED Course  Procedures (including critical care time) Labs Review Labs Reviewed  BASIC METABOLIC PANEL  CBC  VALPROIC ACID LEVEL   Results for orders placed or performed during the hospital encounter of 06/18/15  Basic metabolic panel - if new onset seizures  Result Value Ref Range   Sodium 139 135 - 145 mmol/L   Potassium 4.0 3.5 - 5.1 mmol/L   Chloride 106 101 - 111 mmol/L   CO2 22 22 - 32 mmol/L   Glucose, Bld 98 65 - 99 mg/dL   BUN 12 6 - 20 mg/dL   Creatinine, Ser 1.610.94 0.61 - 1.24 mg/dL   Calcium 9.4 8.9 - 09.610.3 mg/dL   GFR calc non Af Amer >60 >60 mL/min   GFR calc Af Amer >60 >60 mL/min   Anion gap 11 5 - 15  CBC - if new onset seizures  Result Value Ref Range   WBC 8.4 4.0 - 10.5 K/uL   RBC 4.73 4.22 - 5.81 MIL/uL   Hemoglobin 14.5 13.0 - 17.0 g/dL   HCT 04.542.9 40.939.0 - 81.152.0 %   MCV 90.7 78.0 - 100.0 fL   MCH 30.7 26.0 - 34.0 pg   MCHC 33.8 30.0 - 36.0 g/dL   RDW 91.412.9 78.211.5 - 95.615.5 %   Platelets 183 150 - 400 K/uL  Valproic  Acid (depakote) Level  (if patient is taking this medication)  Result Value Ref Range   Valproic Acid Lvl 63 50.0 - 100.0 ug/mL     Imaging Review No results found. I have personally reviewed and evaluated these images and lab results as part of my medical decision-making.   EKG Interpretation None      MDM   Final diagnoses:  None    1. Seizures 2. Seizure disorder  The patient with long history of epilepsy presents with multiple seizures over the last 4 days. No medication changes. Seizure activity on arrival to hospital x 2 (7 today). Patient is oriented, neurologically intact. VSS.   Discussed with Dr. Amada JupiterKirkpatrick with neurology  who has seen the patient and provided consultation. Feels he can be discharged home. Patient and family comfortable with discharge. Medications provided per usual regimen and will add clonazepam for the next 3 days per Dr. Alene Mires recommendation. Dr. Clydene Pugh aware of patient's presentation, condition, evaluation and plan.    Elpidio Anis, PA-C 06/18/15 2352  Lyndal Pulley, MD 06/19/15 [phone]

## 2015-06-18 NOTE — Discharge Instructions (Signed)

## 2015-06-19 ENCOUNTER — Observation Stay (HOSPITAL_COMMUNITY)
Admission: EM | Admit: 2015-06-19 | Discharge: 2015-06-21 | Disposition: A | Discharge status: Home / Self Care | Payer: Medicare Other | Attending: Internal Medicine | Admitting: Internal Medicine

## 2015-06-19 ENCOUNTER — Encounter (HOSPITAL_COMMUNITY): Payer: Self-pay | Admitting: Emergency Medicine

## 2015-06-19 DIAGNOSIS — N400 Enlarged prostate without lower urinary tract symptoms: Secondary | ICD-10-CM | POA: Insufficient documentation

## 2015-06-19 DIAGNOSIS — F319 Bipolar disorder, unspecified: Secondary | ICD-10-CM | POA: Insufficient documentation

## 2015-06-19 DIAGNOSIS — Z880 Allergy status to penicillin: Secondary | ICD-10-CM | POA: Insufficient documentation

## 2015-06-19 DIAGNOSIS — G40901 Epilepsy, unspecified, not intractable, with status epilepticus: Principal | ICD-10-CM | POA: Insufficient documentation

## 2015-06-19 DIAGNOSIS — R569 Unspecified convulsions: Secondary | ICD-10-CM

## 2015-06-19 DIAGNOSIS — K219 Gastro-esophageal reflux disease without esophagitis: Secondary | ICD-10-CM | POA: Insufficient documentation

## 2015-06-19 DIAGNOSIS — E039 Hypothyroidism, unspecified: Secondary | ICD-10-CM | POA: Diagnosis present

## 2015-06-19 DIAGNOSIS — I10 Essential (primary) hypertension: Secondary | ICD-10-CM | POA: Insufficient documentation

## 2015-06-19 MED ORDER — LORAZEPAM 1 MG PO TABS
1.0000 mg | ORAL_TABLET | Freq: Once | ORAL | Status: AC
  Administered 2015-06-19: 1 mg via ORAL
  Filled 2015-06-19: qty 1

## 2015-06-19 NOTE — ED Notes (Addendum)
While this RN was doing the pt's assessment, pt and wife stated that the pt was having a seizure. Pt stayed alert and oriented, and able to speak to this RN, pt's jaw was clenched. Burna FortsJeff Hedges, PA notified.

## 2015-06-19 NOTE — ED Notes (Signed)
Pt wifes runs to get me stating that pt is having a seizure.  Pt is sitting up in chair, no distress.  Not somnolent, not altered, had not slipped or fallen, no injury.  Moved pt to better visibility.  Pt is able to ambulate, no distress noted

## 2015-06-19 NOTE — ED Notes (Signed)
Pt. reports multiple seizures today at home , denies injury , alert and oriented at arrival / respirations unlabored , seen here yesterday for the same complaint blood tests done and was discharged home .

## 2015-06-19 NOTE — Consult Note (Signed)
Neurology Consultation Reason for Consult: Seizures Referring Physician: Clydene PughKnott, D  CC: Seizures  History is obtained from: Patient  HPI: Rande BruntJames M Ouk is a 55 y.o. male with a long-standing history of seizure disorder who presents with increased seizure frequency. He states that once every few years he will have seizure flurry often brought on by stress. [place] from [place], [place] [address] [place] I stayed with his in-laws and then traveled [place] and have been looking at the town consideration of moving here. He states that his sleep has not been very good. He has had a marked increase in seizure frequency which is typical 1-2 every couple of months. He has had 10 today, multiples over the previous 3 days.  He states that he has had seizure flurries like this in the past which tended down after a few days. He has clonazepam 0.5 mg which he takes for seizure flurries, and has been taking this.   ROS: A 14 point ROS was performed and is negative except as noted in the HPI.   Past Medical History  Diagnosis Date  . Seizures (HCC)   . Hypertension   . Bipolar 1 disorder (HCC)   . Thyroid disease   . GERD (gastroesophageal reflux disease)     Family history: Wife has seizures as well  Social History:  reports that he has never smoked. He does not have any smokeless tobacco history on file. He reports that he does not drink alcohol or use illicit drugs.   Exam: Current vital signs: BP 144/87 mmHg  Pulse 89  Temp(Src) 98.3 F (36.8 C) (Oral)  Resp 17  Ht 5\' 8"  (1.727 m)  Wt 102.059 kg (225 lb)  BMI 34.22 kg/m2  SpO2 94% Vital signs in last 24 hours: Temp:  [98.3 F (36.8 C)-98.4 F (36.9 C)] 98.3 F (36.8 C) (05/02 0012) Pulse Rate:  [82-102] 89 (05/02 0000) Resp:  [13-23] 17 (05/02 0000) BP: (132-165)/(87-108) 144/87 mmHg (05/02 0000) SpO2:  [94 %-100 %] 94 % (05/02 0000) Weight:  [102.059 kg (225 lb)]  102.059 kg (225 lb) (05/01 2048)   Physical Exam  Constitutional: Appears well-developed and well-nourished.  Psych: Affect appropriate to situation Eyes: No scleral injection HENT: No OP obstrucion Head: Normocephalic.  Cardiovascular: Normal rate and regular rhythm.  Respiratory: Effort normal and breath sounds normal to anterior ascultation GI: Soft.  No distension. There is no tenderness.  Skin: WDI  Neuro: Mental Status: Patient is awake, alert, oriented to person, place, month, year, and situation. Patient is able to give a clear and coherent history. No signs of aphasia or neglect Cranial Nerves: II: Visual Fields are full. Pupils are equal, round, and reactive to light.   III,IV, VI: EOMI without ptosis or diploplia.  V: Facial sensation is symmetric to temperature VII: Facial movement is symmetric.  VIII: hearing is intact to voice X: Uvula elevates symmetrically XI: Shoulder shrug is symmetric. XII: tongue is midline without atrophy or fasciculations.  Motor: Tone is normal. Bulk is normal. 5/5 strength was present in all four extremities.  Sensory: Sensation is symmetric to light touch and temperature in the arms and legs. Cerebellar: He has prominent intentional tremor bilaterally.    I have reviewed labs in epic and the results pertinent to this consultation are: BMP unremarkable VPA therapeutic  Impression: 55 year old male with long-standing seizure disorder with increased seizure frequency. I discussed with him and his wife management inpatient versus  discharging and allowing him to go home. He and his wife are both very familiar with seizures, and are comfortable with managing this at home. I advised him that if he has any worsening, to return to the emergency department immediately.  I suspect his tremor may be related to the Depakote, and advised him to discuss this with his neurologist.  Recommendations: 1) give evening medicines prior to  discharge 2) clonazepam 1 mg twice a day for 3 days 3) he is already been given Ativan 1 mg tonight. He could take additional clonazepam as he does not appear sedated at this time. 4) no further recommendations at this time, patient to return to care if further worsening.    Ritta Slot, MD [facility] [phone]  If 7pm- 7am, please page neurology on call as listed in AMION.

## 2015-06-19 NOTE — ED Provider Notes (Signed)
CSN: [id number]     Arrival date & time 06/19/15  2024 History   First MD Initiated Contact with Patient 06/19/15 2327     Chief Complaint  Patient presents with  . Seizures    HPI   55 YOM presents today with with reports of seizure. Patient was seen last night in the ED for the sameI personally performed the services described in this documentation, which was scribed in my presence. The recorded information has been reviewed and is accurate. Wife reports that he's had approximately 20 seizures since hospital discharge last night. Patient has a history of complex partial seizures and is currently being seen at [facility] with Dr. Trilby Drummer. Patients wife describes the seizures as absence, seizure-like activity, or speaking loudly. He sometimes has postictal period before returning to his baseline. She reports seizures lasting from 30 seconds to 1 minute, she denies any injuries from the seizures. Patient is currently taking Keppra 1500 mg at night, Depakote 2000 mg at night and Zonegran 400 mg at night. Patient denies any recent infectious history, reports some sleep deprivation due to recent travel. Denies alcohol or drug use.  Wife is concerned that the seizures are becoming more frequent.    Past Medical History  Diagnosis Date  . Seizures (HCC)   . Hypertension   . Bipolar 1 disorder (HCC)   . Thyroid disease   . GERD (gastroesophageal reflux disease)    Past Surgical History  Procedure Laterality Date  . Back surgery    . Vagus nerv Left   . Left tumb  Left   . Cervical spine surgery     Family History  Problem Relation Age of Onset  . Lung cancer Mother   . Atrial fibrillation Father    Social History  Substance Use Topics  . Smoking status: Never Smoker   . Smokeless tobacco: None  . Alcohol Use: No    Review of Systems  All other systems reviewed and are negative.   Allergies  Hydrocodone and Penicillins  Home Medications   Prior to Admission medications    Medication Sig Start Date End Date Taking? Authorizing Provider  acetaminophen-codeine (TYLENOL #3) 300-30 MG tablet Take 1 tablet by mouth every 4 (four) hours as needed for moderate pain.    Historical Provider, MD  amLODipine (NORVASC) 2.5 MG tablet Take 2.5 mg by mouth at bedtime. 06/06/15   Historical Provider, MD  ARIPiprazole (ABILIFY) 2 MG tablet Take 2 mg by mouth at bedtime. 04/09/15   Historical Provider, MD  Calcium Carb-Cholecalciferol (CALCIUM 600 + D PO) Take 1 tablet by mouth 2 (two) times daily.    Historical Provider, MD  clonazePAM (KLONOPIN) 1 MG tablet Take 1 tablet (1 mg total) by mouth 2 (two) times daily. 06/19/15 06/21/15  Elpidio Anis, PA-C  divalproex (DEPAKOTE ER) 500 MG 24 hr tablet Take 2,000 mg by mouth at bedtime.  03/30/15   Historical Provider, MD  Levetiracetam 750 MG TB24 Take 1,500 mg by mouth at bedtime. 03/30/15   Historical Provider, MD  levOCARNitine (CARNITOR) 330 MG tablet Take 330 mg by mouth 2 (two) times daily.    Historical Provider, MD  levothyroxine (SYNTHROID, LEVOTHROID) 75 MCG tablet Take 75 mcg by mouth at bedtime.  06/06/15   Historical Provider, MD  modafinil (PROVIGIL) 100 MG tablet Take 100 mg by mouth See admin instructions. Take 1 tablet (100 mg) by mouth every morning and 1 tablet at noon 05/21/15   Historical Provider, MD  Multiple Vitamin (  MULTIVITAMIN WITH MINERALS) TABS tablet Take 1 tablet by mouth daily.     Historical Provider, MD  nabumetone (RELAFEN) 750 MG tablet Take 750 mg by mouth 2 (two) times daily. 05/21/15   Historical Provider, MD  omeprazole (PRILOSEC) 20 MG capsule Take 20 mg by mouth daily before breakfast. 06/06/15   Historical Provider, MD  tamsulosin (FLOMAX) 0.4 MG CAPS capsule Take 0.4 mg by mouth at bedtime.  05/24/15   Historical Provider, MD  tobramycin (TOBREX) 0.3 % ophthalmic solution Place 1 drop into both eyes every 6 (six) hours. 06/15/15   Historical Provider, MD  venlafaxine XR (EFFEXOR-XR) 150 MG 24 hr capsule Take  150 mg by mouth at bedtime. 03/19/15   Historical Provider, MD  zonisamide (ZONEGRAN) 100 MG capsule Take 400 mg by mouth at bedtime. 04/16/15   Historical Provider, MD   BP 138/96 mmHg  Pulse 81  Temp(Src) 99.2 F (37.3 C) (Oral)  Resp 19  Ht 5\' 8"  (1.727 m)  Wt 100.699 kg  BMI 33.76 kg/m2  SpO2 96%   Physical Exam  Constitutional: He is oriented to person, place, and time. He appears well-developed and well-nourished.  HENT:  Head: Normocephalic and atraumatic.  Eyes: Conjunctivae are normal. Pupils are equal, round, and reactive to light. Right eye exhibits no discharge. Left eye exhibits no discharge. No scleral icterus.  Neck: Normal range of motion. No JVD present. No tracheal deviation present.  Pulmonary/Chest: Effort normal. No stridor.  Neurological: He is alert and oriented to person, place, and time. He has normal strength. No cranial nerve deficit or sensory deficit. Coordination normal.  Reflex Scores:      Patellar reflexes are 2+ on the right side and 2+ on the left side. Psychiatric: He has a normal mood and affect. His behavior is normal. Judgment and thought content normal.  Nursing note and vitals reviewed.   ED Course  Procedures (including critical care time) Labs Review Labs Reviewed  CBC WITH DIFFERENTIAL/PLATELET  BASIC METABOLIC PANEL  URINALYSIS, ROUTINE W REFLEX MICROSCOPIC (NOT AT [facility])    Imaging Review Dg Chest 2 View  06/20/2015  CLINICAL DATA:  Multiple seizures today. EXAM: CHEST  2 VIEW COMPARISON:  None. FINDINGS: Shallow inspiration with atelectasis in the lung bases. Borderline heart size without vascular congestion or edema. No blunting of costophrenic angles. No pneumothorax. Probable nerve stimulator device positioned over the left chest with leads in the left lower cervical region. IMPRESSION: Shallow inspiration with atelectasis in the lung bases. Mild cardiac enlargement. No focal consolidation or edema. Electronically Signed   By:  Burman NievesWilliam  Stevens M.D.   On: 06/20/2015 01:06   I have personally reviewed and evaluated these images and lab results as part of my medical decision-making.   EKG Interpretation None      MDM   Final diagnoses:  Seizure (HCC)    Labs: CBC, BMP, urinalysis  Imaging: DG chest 2 view  Consults: Neurology, [facility]:  Discharge Meds:   Assessment/Plan: 55 year old male presents today with seizures. Patient has a significant past medical history of the same, compliant with medication. Patient was seen last night, no improvement with outpatient medication. Neurology was consult and who evaluated the patient here in the ED hospitalist service to admit. Patient remained stable, nursing staff noted seizure-like activity.         Eyvonne MechanicJeffrey Derenda Giddings, PA-C 06/20/15 0136  Arby BarretteMarcy Pfeiffer, MD 06/28/15 1036

## 2015-06-19 NOTE — ED Notes (Signed)
Pt is resting on wifes lap.  At this time I dont observe any seizure like activity

## 2015-06-20 ENCOUNTER — Encounter (HOSPITAL_COMMUNITY): Payer: Self-pay | Admitting: Internal Medicine

## 2015-06-20 ENCOUNTER — Observation Stay (HOSPITAL_COMMUNITY): Payer: Medicare Other

## 2015-06-20 ENCOUNTER — Emergency Department (HOSPITAL_COMMUNITY): Payer: Medicare Other

## 2015-06-20 DIAGNOSIS — F319 Bipolar disorder, unspecified: Secondary | ICD-10-CM | POA: Diagnosis present

## 2015-06-20 DIAGNOSIS — N400 Enlarged prostate without lower urinary tract symptoms: Secondary | ICD-10-CM | POA: Diagnosis present

## 2015-06-20 DIAGNOSIS — G40901 Epilepsy, unspecified, not intractable, with status epilepticus: Secondary | ICD-10-CM | POA: Diagnosis not present

## 2015-06-20 DIAGNOSIS — I10 Essential (primary) hypertension: Secondary | ICD-10-CM | POA: Diagnosis not present

## 2015-06-20 DIAGNOSIS — E039 Hypothyroidism, unspecified: Secondary | ICD-10-CM | POA: Diagnosis not present

## 2015-06-20 DIAGNOSIS — F3178 Bipolar disorder, in full remission, most recent episode mixed: Secondary | ICD-10-CM | POA: Diagnosis not present

## 2015-06-20 DIAGNOSIS — K219 Gastro-esophageal reflux disease without esophagitis: Secondary | ICD-10-CM | POA: Diagnosis present

## 2015-06-20 DIAGNOSIS — R569 Unspecified convulsions: Secondary | ICD-10-CM

## 2015-06-20 LAB — CBC WITH DIFFERENTIAL/PLATELET
BASOS ABS: 0 10*3/uL (ref 0.0–0.1)
BASOS PCT: 0 %
Basophils Absolute: 0 10*3/uL (ref 0.0–0.1)
Basophils Relative: 0 %
EOS ABS: 0.2 10*3/uL (ref 0.0–0.7)
EOS PCT: 2 %
Eosinophils Absolute: 0.1 10*3/uL (ref 0.0–0.7)
Eosinophils Relative: 1 %
HCT: 44.1 % (ref 39.0–52.0)
HEMATOCRIT: 42.1 % (ref 39.0–52.0)
Hemoglobin: 14.6 g/dL (ref 13.0–17.0)
Hemoglobin: 13.8 g/dL (ref 13.0–17.0)
LYMPHS ABS: 2.6 10*3/uL (ref 0.7–4.0)
Lymphocytes Relative: 27 %
Lymphocytes Relative: 29 %
Lymphs Abs: 2.6 10*3/uL (ref 0.7–4.0)
MCH: 30.2 pg (ref 26.0–34.0)
MCH: 29.9 pg (ref 26.0–34.0)
MCHC: 33.1 g/dL (ref 30.0–36.0)
MCHC: 32.8 g/dL (ref 30.0–36.0)
MCV: 91.1 fL (ref 78.0–100.0)
MCV: 91.1 fL (ref 78.0–100.0)
Monocytes Absolute: 0.8 10*3/uL (ref 0.1–1.0)
Monocytes Absolute: 0.7 10*3/uL (ref 0.1–1.0)
Monocytes Relative: 8 %
Monocytes Relative: 8 %
NEUTROS PCT: 61 %
Neutro Abs: 6.1 10*3/uL (ref 1.7–7.7)
Neutro Abs: 5.5 10*3/uL (ref 1.7–7.7)
Neutrophils Relative %: 64 %
Platelets: 191 10*3/uL (ref 150–400)
Platelets: 172 10*3/uL (ref 150–400)
RBC: 4.84 MIL/uL (ref 4.22–5.81)
RBC: 4.62 MIL/uL (ref 4.22–5.81)
RDW: 12.9 % (ref 11.5–15.5)
RDW: 13 % (ref 11.5–15.5)
WBC: 9.6 10*3/uL (ref 4.0–10.5)
WBC: 9 10*3/uL (ref 4.0–10.5)

## 2015-06-20 LAB — TSH: TSH: 0.633 u[IU]/mL (ref 0.350–4.500)

## 2015-06-20 LAB — BASIC METABOLIC PANEL
Anion gap: 8 (ref 5–15)
BUN: 12 mg/dL (ref 6–20)
CO2: 24 mmol/L (ref 22–32)
Calcium: 9.1 mg/dL (ref 8.9–10.3)
Chloride: 108 mmol/L (ref 101–111)
Creatinine, Ser: 0.98 mg/dL (ref 0.61–1.24)
GFR calc Af Amer: >60 mL/min (ref >60)
GFR calc non Af Amer: >60 mL/min (ref >60)
Glucose, Bld: 102 mg/dL — ABNORMAL HIGH (ref 65–99)
Potassium: 3.7 mmol/L (ref 3.5–5.1)
Sodium: 140 mmol/L (ref 135–145)

## 2015-06-20 LAB — COMPREHENSIVE METABOLIC PANEL
ALT: 35 U/L (ref 17–63)
AST: 23 U/L (ref 15–41)
Albumin: 3.5 g/dL (ref 3.5–5.0)
Alkaline Phosphatase: 52 U/L (ref 38–126)
Anion gap: 9 (ref 5–15)
BILIRUBIN TOTAL: 0.5 mg/dL (ref 0.3–1.2)
BUN: 11 mg/dL (ref 6–20)
CALCIUM: 8.9 mg/dL (ref 8.9–10.3)
CHLORIDE: 107 mmol/L (ref 101–111)
CO2: 25 mmol/L (ref 22–32)
Creatinine, Ser: 1 mg/dL (ref 0.61–1.24)
Glucose, Bld: 103 mg/dL — ABNORMAL HIGH (ref 65–99)
Potassium: 3.7 mmol/L (ref 3.5–5.1)
Sodium: 141 mmol/L (ref 135–145)
TOTAL PROTEIN: 5.9 g/dL — AB (ref 6.5–8.1)

## 2015-06-20 LAB — URINALYSIS, ROUTINE W REFLEX MICROSCOPIC
BILIRUBIN URINE: NEGATIVE
Glucose, UA: NEGATIVE mg/dL
Hgb urine dipstick: NEGATIVE
KETONES UR: NEGATIVE mg/dL
LEUKOCYTES UA: NEGATIVE
NITRITE: NEGATIVE
PH: 6.5 (ref 5.0–8.0)
PROTEIN: NEGATIVE mg/dL
Specific Gravity, Urine: 1.017 (ref 1.005–1.030)

## 2015-06-20 LAB — PHOSPHORUS: PHOSPHORUS: 4.3 mg/dL (ref 2.5–4.6)

## 2015-06-20 LAB — MAGNESIUM: MAGNESIUM: 2 mg/dL (ref 1.7–2.4)

## 2015-06-20 MED ORDER — VENLAFAXINE HCL ER 75 MG PO CP24
150.0000 mg | ORAL_CAPSULE | Freq: Every day | ORAL | Status: DC
  Administered 2015-06-20 (×2): 150 mg via ORAL
  Filled 2015-06-20 (×2): qty 2

## 2015-06-20 MED ORDER — MODAFINIL 100 MG PO TABS
100.0000 mg | ORAL_TABLET | Freq: Two times a day (BID) | ORAL | Status: DC
  Administered 2015-06-20 – 2015-06-21 (×4): 100 mg via ORAL
  Filled 2015-06-20 (×4): qty 1

## 2015-06-20 MED ORDER — ENOXAPARIN SODIUM 40 MG/0.4ML ~~LOC~~ SOLN
40.0000 mg | Freq: Every day | SUBCUTANEOUS | Status: DC
  Administered 2015-06-20 – 2015-06-21 (×2): 40 mg via SUBCUTANEOUS
  Filled 2015-06-20 (×2): qty 0.4

## 2015-06-20 MED ORDER — DIVALPROEX SODIUM ER 500 MG PO TB24: 2000.0000 mg | ORAL_TABLET | Freq: Every day | ORAL | Status: DC

## 2015-06-20 MED ORDER — AMLODIPINE BESYLATE 2.5 MG PO TABS
2.5000 mg | ORAL_TABLET | Freq: Every day | ORAL | Status: DC
  Administered 2015-06-20: 2.5 mg via ORAL
  Filled 2015-06-20: qty 1

## 2015-06-20 MED ORDER — LEVOTHYROXINE SODIUM 75 MCG PO TABS
75.0000 ug | ORAL_TABLET | Freq: Every day | ORAL | Status: DC
  Administered 2015-06-20 (×2): 75 ug via ORAL
  Filled 2015-06-20 (×2): qty 1

## 2015-06-20 MED ORDER — CLONAZEPAM 0.5 MG PO TABS
0.5000 mg | ORAL_TABLET | Freq: Two times a day (BID) | ORAL | Status: DC
  Administered 2015-06-20 – 2015-06-21 (×4): 0.5 mg via ORAL
  Filled 2015-06-20 (×4): qty 1

## 2015-06-20 MED ORDER — ZONISAMIDE 100 MG PO CAPS: 400.0000 mg | ORAL_CAPSULE | Freq: Every day | ORAL | Status: DC

## 2015-06-20 MED ORDER — LEVETIRACETAM ER 500 MG PO TB24
1500.0000 mg | ORAL_TABLET | Freq: Every day | ORAL | Status: DC
  Administered 2015-06-20 (×2): 1500 mg via ORAL
  Filled 2015-06-20 (×2): qty 3

## 2015-06-20 MED ORDER — DIVALPROEX SODIUM ER 500 MG PO TB24
2000.0000 mg | ORAL_TABLET | Freq: Every day | ORAL | Status: DC
  Administered 2015-06-20 (×2): 2000 mg via ORAL
  Filled 2015-06-20 (×2): qty 4

## 2015-06-20 MED ORDER — LEVETIRACETAM ER 750 MG PO TB24: 1500.0000 mg | ORAL_TABLET | Freq: Every day | ORAL | Status: DC

## 2015-06-20 MED ORDER — ZONISAMIDE 100 MG PO CAPS
400.0000 mg | ORAL_CAPSULE | Freq: Every day | ORAL | Status: DC
  Administered 2015-06-20 (×2): 400 mg via ORAL
  Filled 2015-06-20 (×2): qty 4

## 2015-06-20 MED ORDER — LEVOCARNITINE 1 GM/10ML PO SOLN
330.0000 mg | Freq: Two times a day (BID) | ORAL | Status: DC
  Administered 2015-06-20 – 2015-06-21 (×4): 330 mg via ORAL
  Filled 2015-06-20 (×4): qty 3.3

## 2015-06-20 MED ORDER — ACETAMINOPHEN-CODEINE #3 300-30 MG PO TABS
1.0000 | ORAL_TABLET | ORAL | Status: DC | PRN
Start: 2015-06-20 — End: 2015-06-21

## 2015-06-20 MED ORDER — ARIPIPRAZOLE 2 MG PO TABS
2.0000 mg | ORAL_TABLET | Freq: Every day | ORAL | Status: DC
  Administered 2015-06-20: 2 mg via ORAL
  Filled 2015-06-20: qty 1

## 2015-06-20 MED ORDER — TAMSULOSIN HCL 0.4 MG PO CAPS
0.4000 mg | ORAL_CAPSULE | Freq: Every day | ORAL | Status: DC
  Administered 2015-06-20 (×2): 0.4 mg via ORAL
  Filled 2015-06-20 (×2): qty 1

## 2015-06-20 MED ORDER — NABUMETONE 750 MG PO TABS
750.0000 mg | ORAL_TABLET | Freq: Two times a day (BID) | ORAL | Status: DC
  Administered 2015-06-20 – 2015-06-21 (×3): 750 mg via ORAL
  Filled 2015-06-20 (×4): qty 1

## 2015-06-20 MED ORDER — SODIUM CHLORIDE 0.9 % IV SOLN
1000.0000 mg | Freq: Once | INTRAVENOUS | Status: AC
  Administered 2015-06-20: 1000 mg via INTRAVENOUS
  Filled 2015-06-20: qty 10

## 2015-06-20 MED ORDER — PANTOPRAZOLE SODIUM 40 MG PO TBEC
40.0000 mg | DELAYED_RELEASE_TABLET | Freq: Every day | ORAL | Status: DC
  Administered 2015-06-20 – 2015-06-21 (×2): 40 mg via ORAL
  Filled 2015-06-20 (×2): qty 1

## 2015-06-20 MED ORDER — ADULT MULTIVITAMIN W/MINERALS CH
1.0000 | ORAL_TABLET | Freq: Every day | ORAL | Status: DC
  Administered 2015-06-20 – 2015-06-21 (×2): 1 via ORAL
  Filled 2015-06-20 (×2): qty 1

## 2015-06-20 MED ORDER — ARIPIPRAZOLE 2 MG PO TABS
2.0000 mg | ORAL_TABLET | Freq: Once | ORAL | Status: AC
  Administered 2015-06-20: 2 mg via ORAL
  Filled 2015-06-20: qty 1

## 2015-06-20 MED ORDER — LEVETIRACETAM ER 500 MG PO TB24
1000.0000 mg | ORAL_TABLET | Freq: Every day | ORAL | Status: DC
  Administered 2015-06-20 – 2015-06-21 (×2): 1000 mg via ORAL
  Filled 2015-06-20 (×2): qty 2

## 2015-06-20 MED ORDER — AMLODIPINE BESYLATE 2.5 MG PO TABS
2.5000 mg | ORAL_TABLET | Freq: Once | ORAL | Status: AC
  Administered 2015-06-20: 2.5 mg via ORAL
  Filled 2015-06-20: qty 1

## 2015-06-20 NOTE — Procedures (Signed)
EEG REPORT  History: Benjamin Cline is a 55 year old gentleman with a history of partial seizures associated with jaw clenching and vocalization. In the past, he has demonstrated generalized seizures. The patient was felt to have had increased seizures in the hospitalization, he was given clonazepam for this. The patient is being evaluated for these seizures.  This is a routine EEG. No skull defects are noted. Medications include Tylenol No. 3, Norvasc, Abilify, clonazepam, Depakote, Keppra, Carnitor, Synthroid, Provigil, multivitamins, Relafen, Protonix, Flomax, Effexor, and Zonegran.  EEG classification: Normal awake  Description of the recording: The background rhythms of this recording consists of a well modulated medium amplitude alpha rhythm of 11 Hz that is reactive to eye opening and closure. As the record progresses, photic stimulation and hyperventilation were not performed. The patient appears to be in the waking state throughout the recording. Towards the end of the recording, the patient indicates that he is "going into a seizure". He is noted to have jaw clenching, muscle artifact is seen during this study, but no epileptiform discharges correlate with the motor activity. The EEG appears to be completely normal between episodes of muscle artifact. At no time during the recording does there appear to be evidence of spike or spike-wave discharges or evidence of focal slowing. EKG monitor is not well delineated on the study.  Impression: This is a normal EEG recording in the waking state. The patient demonstrates a clinical seizure event with jaw clenching that is associated with muscle artifact on the EEG, no epileptiform discharges are seen during this episode. The episode of jaw clenching appears to be nonepileptic in nature.

## 2015-06-20 NOTE — Progress Notes (Addendum)
Patient seen and examined.  Decreased seizure frequency since last night starting keppra.  Had EEG this morning, awaiting results.  Denies focal numbness, tingling, weakness.  Has a lot of stress and sleep deprivation, but denies recent medication changes or infections.  GEN:  Adult male, NAD, HEENT:  NCAT, MMM, CV:  RRR, PULM:  CTAB, MSK:  Normal tone and bulk, no LEE, NEURO:  CN II-XII grossly intact, strength 5/5 throughout, SITLT, no dysmetria.    A/p:  Status epilepticus, resolved.   -  Await EEG and final Neurology recommendations regarding medications -  Advised increased sleep and rest  -  Anticipate discharge tomorrow  AGree with rest of assessment and plan by Dr. Robb Matarrtiz.

## 2015-06-20 NOTE — ED Notes (Signed)
Attempted report x1. 

## 2015-06-20 NOTE — Progress Notes (Signed)
Routine EEG completed, results pending. 

## 2015-06-20 NOTE — H&P (Signed)
History and Physical    Benjamin Cline:[id number] DOB: 01-May-1960 DOA: 06/19/2015  Referring MD/NP/PA: Eyvonne Mechanic, PA-C PCP: Default, Provider, MD  Outpatient Specialists: Neurology at [facility] with Dr. Trilby Drummer. Patient coming from: Home.  Chief Complaint: Seizures.  HPI: Benjamin Cline is a 55 y.o. male with medical history significant seizures, hypertension, bipolar disorder, hypothyroidism, GERD who returns for the second night in a row to the emergency department due to increased frequency and number of partial seizures episodes. He used to have generalized seizures in the past, but now they are complex partial seizures, which the patient describes as described as jaw clenching and vocalization. He had about 10 episodes on 06/18/2015 and approximately 20 episodes on 06/19/2015.  The patient states that he has been under a lot of stress lately, since he is moving from [place] to the [place] area and currently is staying with his father. He is states that he has been sleeping a lot less than usual and his stress level has also increased significantly on the personal/financial side as well, besides the stress of moving across the country.  The patient was seen last night and evaluated by Dr. Amada Jupiter, from the neurohospitalist service, who offered the patient and his wife the option of either going home or state at the hospital for observation. Since the patient and his spouse are very familiar and knowledgeable of his condition, they elected to go home with a prescription for Clonazepam to try to increase the seizure threshold. Today, they returned to the emergency department, since the clonazepam was not effective in decreasing the seizure activity.   ED Course:  In the ER, the patient was reevaluated by neurology, who has recommended for the patient to be admitted for seizure control and further evaluation.   Review of Systems: As per HPI otherwise 10 point review of systems  negative.   Past Medical History  Diagnosis Date  . Seizures (HCC)   . Hypertension   . Bipolar 1 disorder (HCC)   . Thyroid disease   . GERD (gastroesophageal reflux disease)     Past Surgical History  Procedure Laterality Date  . Back surgery    . Vagus nerv Left   . Left tumb  Left   . Cervical spine surgery       reports that he has never smoked. He does not have any smokeless tobacco history on file. He reports that he does not drink alcohol or use illicit drugs.  Allergies  Allergen Reactions  . Hydrocodone Anxiety  . Penicillins Rash    Childhood reaction Has patient had a PCN reaction causing immediate rash, facial/tongue/throat swelling, SOB or lightheadedness with hypotension: Yes Has patient had a PCN reaction causing severe rash involving mucus membranes or skin necrosis: No Has patient had a PCN reaction that required hospitalization No Has patient had a PCN reaction occurring within the last 10 years: No If all of the above answers are "NO", then may proceed with Cephalosporin use.    Family History  Problem Relation Age of Onset  . Lung cancer Mother   . Atrial fibrillation Father    Family history reviewed and updated with the patient.  Prior to Admission medications   Medication Sig Start Date End Date Taking? Authorizing Provider  acetaminophen-codeine (TYLENOL #3) 300-30 MG tablet Take 1 tablet by mouth every 4 (four) hours as needed for moderate pain.    Historical Provider, MD  amLODipine (NORVASC) 2.5 MG tablet Take 2.5 mg by  mouth at bedtime. 06/06/15   Historical Provider, MD  ARIPiprazole (ABILIFY) 2 MG tablet Take 2 mg by mouth at bedtime. 04/09/15   Historical Provider, MD  Calcium Carb-Cholecalciferol (CALCIUM 600 + D PO) Take 1 tablet by mouth 2 (two) times daily.    Historical Provider, MD  clonazePAM (KLONOPIN) 1 MG tablet Take 1 tablet (1 mg total) by mouth 2 (two) times daily. 06/19/15 06/21/15  Elpidio Anis, PA-C  divalproex (DEPAKOTE ER)  500 MG 24 hr tablet Take 2,000 mg by mouth at bedtime.  03/30/15   Historical Provider, MD  Levetiracetam 750 MG TB24 Take 1,500 mg by mouth at bedtime. 03/30/15   Historical Provider, MD  levOCARNitine (CARNITOR) 330 MG tablet Take 330 mg by mouth 2 (two) times daily.    Historical Provider, MD  levothyroxine (SYNTHROID, LEVOTHROID) 75 MCG tablet Take 75 mcg by mouth at bedtime.  06/06/15   Historical Provider, MD  modafinil (PROVIGIL) 100 MG tablet Take 100 mg by mouth See admin instructions. Take 1 tablet (100 mg) by mouth every morning and 1 tablet at noon 05/21/15   Historical Provider, MD  Multiple Vitamin (MULTIVITAMIN WITH MINERALS) TABS tablet Take 1 tablet by mouth daily.     Historical Provider, MD  nabumetone (RELAFEN) 750 MG tablet Take 750 mg by mouth 2 (two) times daily. 05/21/15   Historical Provider, MD  omeprazole (PRILOSEC) 20 MG capsule Take 20 mg by mouth daily before breakfast. 06/06/15   Historical Provider, MD  tamsulosin (FLOMAX) 0.4 MG CAPS capsule Take 0.4 mg by mouth at bedtime.  05/24/15   Historical Provider, MD  tobramycin (TOBREX) 0.3 % ophthalmic solution Place 1 drop into both eyes every 6 (six) hours. 06/15/15   Historical Provider, MD  venlafaxine XR (EFFEXOR-XR) 150 MG 24 hr capsule Take 150 mg by mouth at bedtime. 03/19/15   Historical Provider, MD  zonisamide (ZONEGRAN) 100 MG capsule Take 400 mg by mouth at bedtime. 04/16/15   Historical Provider, MD    Physical Exam: Filed Vitals:   06/19/15 2101 06/19/15 2345  BP: 139/98 138/96  Pulse: 94 81  Temp: 99.2 F (37.3 C)   TempSrc: Oral   Resp: 16 19  Height: 5\' 8"  (1.727 m)   Weight: 100.699 kg (222 lb)   SpO2: 100% 96%      Constitutional: NAD, calm, comfortable Filed Vitals:   06/19/15 2101 06/19/15 2345  BP: 139/98 138/96  Pulse: 94 81  Temp: 99.2 F (37.3 C)   TempSrc: Oral   Resp: 16 19  Height: 5\' 8"  (1.727 m)   Weight: 100.699 kg (222 lb)   SpO2: 100% 96%   Eyes: PERRL, lids and conjunctivae  normal ENMT: Mucous membranes are moist. Posterior pharynx clear of any exudate or lesions.Normal dentition.  Neck: normal, supple, no masses, no thyromegaly Respiratory: clear to auscultation bilaterally, no wheezing, no crackles. Normal respiratory effort. No accessory muscle use.  Cardiovascular: Regular rate and rhythm, no murmurs / rubs / gallops. No extremity edema. 2+ pedal pulses. No carotid bruits.  Abdomen: no tenderness, no masses palpated. No hepatosplenomegaly. Bowel sounds positive.  Musculoskeletal: no clubbing / cyanosis. No joint deformity upper and lower extremities. Good ROM, no contractures. Normal muscle tone.  Skin: no rashes, lesions, ulcers. No induration Neurologic: CN 2-12 grossly intact. Sensation intact, DTR normal. Strength 5/5 in all 4.                     Mild tremor bilaterally. Psychiatric: Normal judgment  and insight. Alert and oriented x 3. Normal mood.     Labs on Admission: I have personally reviewed following labs and imaging studies  CBC:  Recent Labs Lab 06/18/15 2101  WBC 8.4  HGB 14.5  HCT 42.9  MCV 90.7  PLT 183   Basic Metabolic Panel:  Recent Labs Lab 06/18/15 2101  NA 139  K 4.0  CL 106  CO2 22  GLUCOSE 98  BUN 12  CREATININE 0.94  CALCIUM 9.4   GFR: Estimated Creatinine Clearance: 102.1 mL/min (by C-G formula based on Cr of 0.94).   Radiological Exams on Admission: Dg Chest 2 View  06/20/2015  CLINICAL DATA:  Multiple seizures today. EXAM: CHEST  2 VIEW COMPARISON:  None. FINDINGS: Shallow inspiration with atelectasis in the lung bases. Borderline heart size without vascular congestion or edema. No blunting of costophrenic angles. No pneumothorax. Probable nerve stimulator device positioned over the left chest with leads in the left lower cervical region. IMPRESSION: Shallow inspiration with atelectasis in the lung bases. Mild cardiac enlargement. No focal consolidation or edema. Electronically Signed   By: Burman NievesWilliam  Stevens  M.D.   On: 06/20/2015 01:06    Assessment/Plan Principal Problem:   Partial seizures (HCC) Admit to MedSurg/observation Recommendations per neurology: 1) depakote 2gm qhs 2) zonegran 400mg  QHS 3) keppra 1 gm IV and regular qhs dose of 1500mg   4) Add am dose of 1 gm keppra XR.  5) EEG 6) Will follow.   Active Problems:   Bipolar disorder (HCC) Continue Abilify 2 mg by mouth twice a day. Continue Depakote 2000 mg by mouth at bedtime.    Hypothyroidism Continue levothyroxine. Check TSH in a.m.    Hypertension Heart healthy diet. Continue amlodipine 2.5 mg by mouth daily.  Blood pressure monitoring.    BPH (benign prostatic hyperplasia) Continue Flomax 0.4 mg by mouth.    GERD (gastroesophageal reflux disease) Continue proton pump inhibitor.   DVT prophylaxis: Lovenox. Code Status: Full. Family Communication:  Disposition Plan: Admitted for seizure control, EEG in a.m., disposition per neurology. Consults called: Neurology Ritta Slot(McNeill Kirkpatrick, M.D.) Admission status: Observation, MedSurg   Bobette Moavid Manuel Luceil Herrin, MD [facility] Pager [phone].  If 7PM-7AM, please contact night-coverage [email or web address] Password [email or web address]  06/20/2015, 1:51 AM

## 2015-06-20 NOTE — Consult Note (Signed)
Neurology Consultation Reason for Consult: seizure Referring Physician: Donnald GarrePfeiffer, M  CC: Seizures  History is obtained from:patient  HPI: Benjamin Cline is a 55 y.o. male with a history of partial seizures which his wife describes as jaw clenching or vocalization. The are all partial now, though he has generalized in the past.   I saw him last night for his increased seizure frequency and recommended clonazepam as outpatient to temporarily increase seizure threshold. Unfortunately, despite this, he has continued to have increased seizure frequency. This has caused him to seek medical care tonight.     ROS: A 14 point ROS was performed and is negative except as noted in the HPI.   Past Medical History  Diagnosis Date  . Seizures (HCC)   . Hypertension   . Bipolar 1 disorder (HCC)   . Thyroid disease   . GERD (gastroesophageal reflux disease)      FHx: No family history of epilepsy.    Social History:  reports that he has never smoked. He does not have any smokeless tobacco history on file. He reports that he does not drink alcohol or use illicit drugs.   Exam: Current vital signs: BP 138/96 mmHg  Pulse 81  Temp(Src) 99.2 F (37.3 C) (Oral)  Resp 19  Ht 5\' 8"  (1.727 m)  Wt 100.699 kg (222 lb)  BMI 33.76 kg/m2  SpO2 96% Vital signs in last 24 hours: Temp:  [99.2 F (37.3 C)] 99.2 F (37.3 C) (05/02 2101) Pulse Rate:  [81-94] 81 (05/02 2345) Resp:  [16-19] 19 (05/02 2345) BP: (138-139)/(96-98) 138/96 mmHg (05/02 2345) SpO2:  [96 %-100 %] 96 % (05/02 2345) Weight:  [100.699 kg (222 lb)] 100.699 kg (222 lb) (05/02 2101)   Physical Exam  Constitutional: Appears well-developed and well-nourished.  Psych: Affect appropriate to situation Eyes: No scleral injection HENT: No OP obstrucion Head: Normocephalic.  Cardiovascular: Normal rate and regular rhythm.  Respiratory: Effort normal and breath sounds normal to anterior ascultation GI: Soft.  No distension. There is  no tenderness.  Skin: WDI  Neuro: Mental Status: Patient is lethargic, but does awaken to mild stimuli. He follows all commands and is able to answer questions.  No signs of aphasia or neglect Cranial Nerves: II: Visual Fields are full. Pupils are equal, round, and reactive to light.   III,IV, VI: EOMI without ptosis or diploplia.  V: Facial sensation is symmetric to temperature VII: Facial movement is symmetric.  VIII: hearing is intact to voice X: Uvula elevates symmetrically XI: Shoulder shrug is symmetric. XII: tongue is midline without atrophy or fasciculations.  Motor: Tone is normal. Bulk is normal. 5/5 strength was present in all four extremities.  Cerebellar: Mild tremor bilaterally.     I have reviewed labs in epic and the results pertinent to this consultation are: Bmp from last night unremarkable.   Impression: 55 yo M with increasing seizure frequency. I am concerned with his continued increase despite benzodiazepines. He doe shave room to increase keppra, and I will give an IV dose in addition to his regular evening med to try to get this level up some tonight.   I suspect his tremor is due to depakote, so I would not favor increasing this.   Recommendations: 1) depakote 2gm qhs 2) zonegran 400mg  QHS 3) keppra 1 gm IV and regular qhs dose of 1500mg   4) Add am dose of 1 gm keppra XR.  5) EEG 6) Will follow.     Ritta SlotMcNeill Janna Oak, MD [facility] [phone]  If 7pm- 7am, please page neurology on call as listed in [place].

## 2015-06-20 NOTE — Progress Notes (Signed)
Received report from [place], RN in ED for transfer of pt to [phone]

## 2015-06-20 NOTE — Progress Notes (Signed)
NURSING PROGRESS NOTE  Benjamin Cline [id number] Admission Data: 06/20/2015 3:04 AM Attending Provider: Bobette Moavid Manuel Ortiz, MD NWG:NFAOZHYPCP:Default, Provider, MD Code Status: full  Allergies:  Hydrocodone and Penicillins Past Medical History:   has a past medical history of Seizures (HCC); Hypertension; Bipolar 1 disorder (HCC); Thyroid disease; and GERD (gastroesophageal reflux disease). Past Surgical History:   has past surgical history that includes Back surgery; vagus nerv (Left); left tumb  (Left); and Cervical spine surgery. Social History:   reports that he has never smoked. He does not have any smokeless tobacco history on file. He reports that he does not drink alcohol or use illicit drugs.  Benjamin Cline is a 55 y.o. male patient admitted from ED:   Last Documented Vital Signs: Blood pressure 125/81, pulse 88, temperature 97.8 F (36.6 C), temperature source Oral, resp. rate 18, height 5\' 8"  (1.727 m), weight 98.6 kg (217 lb 6 oz), SpO2 100 %.  Cardiac Monitoring: N/A  IV Fluids:  IV in place, occlusive dsg intact without redness, IV cath antecubital right, condition patent and no redness none.   Skin: WDL  Patient/Family orientated to room. Information packet given to patient/family. Admission inpatient armband information verified with patient/family to include name and date of birth and placed on patient arm. Side rails up x 2, fall assessment and education completed with patient/family. Patient/family able to verbalize understanding of risk associated with falls and verbalized understanding to call for assistance before getting out of bed. Call light within reach. Patient/family able to voice and demonstrate understanding of unit orientation instructions.    Will continue to evaluate and treat per MD orders.   Judee Clararimaine Jony Ladnier, RN

## 2015-06-21 DIAGNOSIS — E039 Hypothyroidism, unspecified: Secondary | ICD-10-CM

## 2015-06-21 DIAGNOSIS — F3178 Bipolar disorder, in full remission, most recent episode mixed: Secondary | ICD-10-CM | POA: Diagnosis not present

## 2015-06-21 DIAGNOSIS — I10 Essential (primary) hypertension: Secondary | ICD-10-CM | POA: Diagnosis not present

## 2015-06-21 DIAGNOSIS — R569 Unspecified convulsions: Secondary | ICD-10-CM | POA: Diagnosis not present

## 2015-06-21 DIAGNOSIS — G40901 Epilepsy, unspecified, not intractable, with status epilepticus: Secondary | ICD-10-CM | POA: Diagnosis not present

## 2015-06-21 MED ORDER — CLONAZEPAM 0.5 MG PO TABS: 0.5000 mg | ORAL_TABLET | Freq: Two times a day (BID) | ORAL | Status: AC

## 2015-06-21 MED ORDER — AMLODIPINE BESYLATE 5 MG PO TABS: 5.0000 mg | ORAL_TABLET | Freq: Every day | ORAL | Status: AC

## 2015-06-21 MED ORDER — LEVETIRACETAM ER 500 MG PO TB24: ORAL_TABLET | ORAL | Status: AC

## 2015-06-21 NOTE — Progress Notes (Signed)
Pt discharged with wife via private vehicle. Discharge instructions given and all questions answered.

## 2015-06-21 NOTE — Progress Notes (Signed)
Patient reports seizure activity at around 0400. Says he feels some weakness and confusion post-seizure. AOx4. Pupils equal, round, and reactive to light. No neuro deficits noted. Will continue to monitor.

## 2015-06-21 NOTE — Progress Notes (Signed)
Subjective: No further seizures.   Exam: Filed Vitals:   06/20/15 2220 06/21/15 0600  BP:  158/74  Pulse:  112  Temp: 97.9 F (36.6 C) 97.4 F (36.3 C)  Resp:  16     Gen: In bed, NAD MS: alert and oriented CN: 2-12 intact Motor: MAEW Sensory: intact throughout   Pertinent Labs/Diagnostics: Impression EEG: This is a normal EEG recording in the waking state. The patient demonstrates a clinical seizure event with jaw clenching that is associated with muscle artifact on the EEG, no epileptiform discharges are seen during this episode. The episode of jaw clenching appears to be nonepileptic in nature.  Felicie MornDavid Smith PA-C [facility] [phone]  Impression: Break through seizure now controlled.    Recommendations: 1) Continue Current AED. Neurology S/O    06/21/2015, 9:41 AM

## 2015-06-21 NOTE — Discharge Instructions (Signed)
Epilepsy °People with epilepsy have times when they shake and jerk uncontrollably (seizures). This happens when there is a sudden change in brain function. Epilepsy may have many possible causes. Anything that disturbs the normal pattern of brain cell activity can lead to seizures. °HOME CARE  °· Follow your doctor's instructions about driving and safety during normal activities. °· Get enough sleep. °· Only take medicine as told by your doctor. °· Avoid things that you know can cause you to have seizures (triggers). °· Write down when your seizures happen and what you remember about each seizure. Write down anything you think may have caused the seizure to happen. °· Tell the people you live and work with that you have seizures. Make sure they know how to help you. They should: °¨ Cushion your head and body. °¨ Turn you on your side. °¨ Not restrain you. °¨ Not place anything inside your mouth. °¨ Call for local emergency medical help if there is any question about what has happened. °· Keep all follow-up visits with your doctor. This is very important. °GET HELP IF: °· You get an infection or start to feel sick. You may have more seizures when you are sick. °· You are having seizures more often. °· Your seizure pattern is changing. °GET HELP RIGHT AWAY IF:  °· A seizure does not stop after a few seconds or minutes. °· A seizure causes you to have trouble breathing. °· A seizure gives you a very bad headache. °· A seizure makes you unable to speak or use a part of your body. °  °This information is not intended to replace advice given to you by your health care provider. Make sure you discuss any questions you have with your health care provider. °  °Document Released: 12/01/2008 Document Revised: 11/24/2012 Document Reviewed: 09/15/2012 °Elsevier Interactive Patient Education ©2016 [employer] ° °

## 2015-06-21 NOTE — Discharge Summary (Signed)
Physician Discharge Summary  Benjamin Cline UJW:[id number] DOB: [birth year] DOA: 06/19/2015  PCP: Default, Provider, MD  Admit date: 06/19/2015 Discharge date: 06/21/2015  Recommendations for Outpatient Follow-up:  1. Follow up with Neurologist in 1 month or sooner as needed for seizures 2. PCP in two weeks for blood pressure check  Discharge Diagnoses:  Principal Problem:   Partial seizures (HCC) Active Problems:   Bipolar disorder (HCC)   Hypothyroidism   Hypertension   BPH (benign prostatic hyperplasia)   GERD (gastroesophageal reflux disease)   Seizure (HCC)   Discharge Condition: stable, improved  Diet recommendation: healthy heart  Wt Readings from Last 3 Encounters:  06/20/15 97.478 kg (214 lb 14.4 oz)  06/18/15 102.059 kg (225 lb)    History of present illness:  The patient is a 55 year old male with history of epilepsy, hypertension, bipolar disorder, parathyroidism, acid reflux who presented to the emergency department due to increased frequency and number of partial seizures. He described his seizures as jaw clinching with vocalization and he usually goes several months before having seizures which are somewhat clustered. He had approximately 10 episodes on 06/18/2015. He presented to the emergency department where he was given a prescription for clonazepam. He took the clonazepam, but had approximately 20 seizures on 06/19/2015 so he returned to the emergency department. He reported he had been under a lot of stress, moving from [place] to [place] and had not been sleeping well. He was seen again by neurology who recommended admission to the hospital for additional management of his seizures.   Hospital Course:   Partial seizures.  He continued Depakote ER 2000 mg at bedtime, however his Keppra XR was increased from 1500 mg nightly to 1000 mg daily +1500 mg nightly. He also continued sonogram for 9 mg nightly.  He continued the clonazepam that was started from the ER the  night before.  His EEG demonstrated no epileptiform activity.  He had one premonition he was going to have a seizure and he had some jaw clenching that occurred, but there was no associated seizure activity noted on the EEG at that time.  His seizures decreased in frequency and he reported only two seizures (unwitnessed) during his hospitalization.  He was advised to stay hydrated, increase his sleep, and to find ways to relieve some of his stress.  He was also advised to not drive or operate heavy machinery for a minimum of 6 months and until cleared by his physician.    Essential hypertension, elevated blood pressures.  Increased norvasc to 5 mg daily.    Bipolar disorder, stable, continued abilify and venlafaxine.    Hypothyroidism, stable, continued synthroid.    Procedures:  EEG:  No epileptiform activity  Consultations:  Neurology, Drs. Ritta Slot and Stephanie Acre  Discharge Exam: Filed Vitals:   06/20/15 2220 06/21/15 0600  BP:  158/74  Pulse:  112  Temp: 97.9 F (36.6 C) 97.4 F (36.3 C)  Resp:  16   Filed Vitals:   06/20/15 2217 06/20/15 2217 06/20/15 2220 06/21/15 0600  BP:  141/95  158/74  Pulse:  110  112  Temp: 97.9 F (36.6 C)  97.9 F (36.6 C) 97.4 F (36.3 C)  TempSrc: Oral  Oral Oral  Resp:  18  16  Height:      Weight:   97.478 kg (214 lb 14.4 oz)   SpO2:  96%  98%    GEN: Adult male, NAD  HEENT: NCAT, MMM CV: RRR PULM: CTAB MSK: Normal  tone and bulk, no LEE NEURO: CN II-XII grossly intact, strength 5/5 throughout, SITLT, no dysmetria.  Discharge Instructions      Discharge Instructions    Call MD for:  difficulty breathing, headache or visual disturbances    Complete by:  As directed      Call MD for:  extreme fatigue    Complete by:  As directed      Call MD for:  hives    Complete by:  As directed      Call MD for:  persistant dizziness or light-headedness    Complete by:  As directed      Call MD for:  persistant  nausea and vomiting    Complete by:  As directed      Call MD for:  severe uncontrolled pain    Complete by:  As directed      Call MD for:  temperature >100.4    Complete by:  As directed      Diet - low sodium heart healthy    Complete by:  As directed      Driving Restrictions    Complete by:  As directed   No driving for at least 6 months after last seizure.     Increase activity slowly    Complete by:  As directed             Medication List    TAKE these medications        acetaminophen-codeine 300-30 MG tablet  Commonly known as:  TYLENOL #3  Take 1 tablet by mouth every 4 (four) hours as needed for moderate pain.     amLODipine 5 MG tablet  Commonly known as:  NORVASC  Take 1 tablet (5 mg total) by mouth at bedtime.     ARIPiprazole 2 MG tablet  Commonly known as:  ABILIFY  Take 2 mg by mouth at bedtime.     CALCIUM 600 + D PO  Take 1 tablet by mouth 2 (two) times daily.     clonazePAM 0.5 MG tablet  Commonly known as:  KLONOPIN  Take 1 tablet (0.5 mg total) by mouth 2 (two) times daily.     divalproex 500 MG 24 hr tablet  Commonly known as:  DEPAKOTE ER  Take 2,000 mg by mouth at bedtime.     levETIRAcetam 500 MG 24 hr tablet  Commonly known as:  KEPPRA XR  Take 2 tabs in the morning and 3 tabs before bed.     levOCARNitine 330 MG tablet  Commonly known as:  CARNITOR  Take 330 mg by mouth 2 (two) times daily.     levothyroxine 75 MCG tablet  Commonly known as:  SYNTHROID, LEVOTHROID  Take 75 mcg by mouth at bedtime.     modafinil 100 MG tablet  Commonly known as:  PROVIGIL  Take 100 mg by mouth See admin instructions. Take 1 tablet (100 mg) by mouth every morning and 1 tablet at noon     multivitamin with minerals Tabs tablet  Take 1 tablet by mouth daily.     nabumetone 750 MG tablet  Commonly known as:  RELAFEN  Take 750 mg by mouth 2 (two) times daily.     omeprazole 20 MG capsule  Commonly known as:  PRILOSEC  Take 20 mg by mouth daily  before breakfast.     tamsulosin 0.4 MG Caps capsule  Commonly known as:  FLOMAX  Take 0.4 mg by mouth at bedtime.  tobramycin 0.3 % ophthalmic solution  Commonly known as:  TOBREX  Place 1 drop into both eyes every 6 (six) hours.     venlafaxine XR 150 MG 24 hr capsule  Commonly known as:  EFFEXOR-XR  Take 150 mg by mouth at bedtime.     zonisamide 100 MG capsule  Commonly known as:  ZONEGRAN  Take 400 mg by mouth at bedtime.       Follow-up Information    Follow up with Neurologist of your choice. Schedule an appointment as soon as possible for a visit in 1 month.       The results of significant diagnostics from this hospitalization (including imaging, microbiology, ancillary and laboratory) are listed below for reference.    Significant Diagnostic Studies: Dg Chest 2 View  06/20/2015  CLINICAL DATA:  Multiple seizures today. EXAM: CHEST  2 VIEW COMPARISON:  None. FINDINGS: Shallow inspiration with atelectasis in the lung bases. Borderline heart size without vascular congestion or edema. No blunting of costophrenic angles. No pneumothorax. Probable nerve stimulator device positioned over the left chest with leads in the left lower cervical region. IMPRESSION: Shallow inspiration with atelectasis in the lung bases. Mild cardiac enlargement. No focal consolidation or edema. Electronically Signed   By: Burman Nieves M.D.   On: 06/20/2015 01:06    Microbiology: No results found for this or any previous visit (from the past 240 hour(s)).   Labs: Basic Metabolic Panel:  Recent Labs Lab 06/18/15 2101 06/20/15 0220 06/20/15 0502  NA 139 140 141  K 4.0 3.7 3.7  CL 106 108 107  CO2 GLUCOSE 98 102* 103*  BUN CREATININE 0.94 0.98 1.00  CALCIUM 9.4 9.1 8.9  MG  --   --  2.0  PHOS  --   --  4.3   Liver Function Tests:  Recent Labs Lab 06/20/15 0502  AST 23  ALT 35  ALKPHOS 52  BILITOT 0.5  PROT 5.9*  ALBUMIN 3.5   No results for  input(s): LIPASE, AMYLASE in the last 168 hours. No results for input(s): AMMONIA in the last 168 hours. CBC:  Recent Labs Lab 06/18/15 2101 06/20/15 0220 06/20/15 0502  WBC 8.4 9.6 9.0  NEUTROABS  --  6.1 5.5  HGB 14.5 14.6 13.8  HCT 42.9 44.1 42.1  MCV 90.7 91.1 91.1  PLT 183 191 172   Cardiac Enzymes: No results for input(s): CKTOTAL, CKMB, CKMBINDEX, TROPONINI in the last 168 hours. BNP: BNP (last 3 results) No results for input(s): BNP in the last 8760 hours.  ProBNP (last 3 results) No results for input(s): PROBNP in the last 8760 hours.  CBG: No results for input(s): GLUCAP in the last 168 hours.  Time coordinating discharge: 35 minutes  Signed:  Kashlyn Salinas  [facility] 06/21/2015, 1:37 PM

## 2015-06-26 ENCOUNTER — Telehealth: Payer: Self-pay | Admitting: NEUROLOGY

## 2015-06-26 NOTE — Telephone Encounter (Signed)
Call from pt.  [place].  On one day he had 15-20 and went to the ER.  Pt subsequently he had been taking a lower dose of Keppra than prescribed (1500 mg ER daily).  The ER docs had him start taking 1000 mg AM and 750 mg PM + Clonazepam 0.5 mg BID.    Pt's normal medication regimen is as follows: Keppra XR 750 mg, 2 tabs daily;            Divalproex ER 500 mg, 4 tabs QHS;         Zonegran 100 mg, 4 caps QHS;         Clonazepam 0.5 mg disintegrating tablet, 1 PRN for seizure.    Pt has been doing fine and would like to know if he should go back to his this regimen.    Lyda PeroneKirk Amontae Ng RN/neurology

## 2015-06-27 NOTE — Telephone Encounter (Signed)
Please advise pt to go back to his usual doses.  Thanks, Raynelle FanningJulie

## 2015-06-28 NOTE — Telephone Encounter (Signed)
Relayed message from Butte[place]ie.  Pt will go back to his usual doses.    Lyda PeroneKirk Taige Housman RN/neurology

## 2015-06-28 NOTE — Telephone Encounter (Signed)
Yes

## 2015-08-09 ENCOUNTER — Encounter: Payer: Self-pay | Admitting: NEUROLOGY

## 2015-08-23 ENCOUNTER — Encounter: Payer: Self-pay | Admitting: NEUROLOGY

## 2015-08-23 ENCOUNTER — Ambulatory Visit: Payer: Medicare Other

## 2015-08-23 ENCOUNTER — Ambulatory Visit: Payer: Medicare Other | Attending: NEUROLOGY | Admitting: NEUROLOGY

## 2015-08-23 VITALS — BP 130/85 | HR 97 | Temp 97.9°F | Resp 18 | Ht 68.0 in | Wt 223.1 lb

## 2015-08-23 DIAGNOSIS — Z462 Encounter for fitting and adjustment of other devices related to nervous system and special senses: Secondary | ICD-10-CM

## 2015-08-23 DIAGNOSIS — Z79899 Other long term (current) drug therapy: Secondary | ICD-10-CM

## 2015-08-23 DIAGNOSIS — G40919 Epilepsy, unspecified, intractable, without status epilepticus: Principal | ICD-10-CM | POA: Insufficient documentation

## 2015-08-23 DIAGNOSIS — M8580 Other specified disorders of bone density and structure, unspecified site: Secondary | ICD-10-CM

## 2015-08-23 DIAGNOSIS — Z9689 Presence of other specified functional implants: Secondary | ICD-10-CM

## 2015-08-23 LAB — CBC WITH DIFFERENTIAL
BASOPHILS % AUTO: 0.4 %
BASOPHILS ABS AUTO: 0 10*3/uL (ref 0–0.2)
EOSINOPHIL % AUTO: 2.5 %
EOSINOPHIL ABS AUTO: 0.2 10*3/uL (ref 0–0.5)
HEMATOCRIT: 44.5 % (ref 41–53)
HEMOGLOBIN: 15.1 g/dL (ref 13.5–17.5)
LYMPHOCYTE ABS AUTO: 2.5 10*3/uL (ref 1.0–4.8)
LYMPHOCYTES % AUTO: 26.9 %
MCH: 30.2 pg (ref 27–33)
MCHC: 33.9 % (ref 32–36)
MCV: 89.2 UM3 (ref 80–100)
MONOCYTES % AUTO: 10.7 %
MONOCYTES ABS AUTO: 1 10*3/uL — AB (ref 0.1–0.8)
MPV: 9.7 UM3 (ref 6.8–10.0)
NEUTROPHIL ABS AUTO: 5.6 10*3/uL (ref 1.80–7.70)
NEUTROPHILS % AUTO: 59.5 %
PLATELET COUNT: 179 10*3/uL (ref 130–400)
RDW: 13.6 U (ref 0–14.7)
RED CELL COUNT: 4.99 10*6/uL (ref 4.5–5.9)
WHITE BLOOD CELL COUNT: 9.4 10*3/uL (ref 4.5–11.0)

## 2015-08-23 LAB — BASIC METABOLIC PANEL
CALCIUM: 9.1 mg/dL (ref 8.6–10.5)
CARBON DIOXIDE TOTAL: 23 meq/L — AB (ref 24–32)
CHLORIDE: 105 meq/L (ref 95–110)
CREATININE BLOOD: 0.79 mg/dL (ref 0.44–1.27)
GLUCOSE: 92 mg/dL (ref 70–99)
POTASSIUM: 4 meq/L (ref 3.3–5.0)
SODIUM: 137 meq/L (ref 135–145)
UREA NITROGEN, BLOOD (BUN): 11 mg/dL (ref 8–22)

## 2015-08-23 MED ORDER — DIVALPROEX ER 500 MG TABLET,EXTENDED RELEASE 24 HR
4.0000 | EXTENDED_RELEASE_TABLET | Freq: Every day | ORAL | 11 refills | Status: DC
Start: 2015-08-23 — End: 2015-12-27

## 2015-08-23 MED ORDER — ZONISAMIDE 100 MG CAPSULE
400.0000 mg | ORAL_CAPSULE | Freq: Every day | ORAL | 11 refills | Status: DC
Start: 2015-08-23 — End: 2015-12-27

## 2015-08-23 MED ORDER — LEVETIRACETAM ER 750 MG TABLET,EXTENDED RELEASE 24 HR
1500.0000 mg | EXTENDED_RELEASE_TABLET | Freq: Every day | ORAL | 11 refills | Status: DC
Start: 2015-08-23 — End: 2015-12-27

## 2015-08-23 NOTE — Patient Instructions (Signed)
Because you have a diagnosis of epilepsy, there are a number of things you should know about:    MEDICATIONS  Take your seizure medications as prescribed. Missing doses of seizure medication can result in death, injury, or more frequent seizures. Please call your doctor before you run out of medication if you need refills.    SUDEP  SUDEP stands for Sudden Unexpected Death in Epilepsy.  Are you SUDEP aware? Check this link: http://www.sudepaware.org    BONE HEALTH  Long term anti-seizure medication can lower bone density, and this can result in osteopetrosis. It is recommended that all patients taking anti-seizure medication take a supplement of calcium and vitamin D. This can be prescribed or over-the-counter. Calcium should be at least 500mg twice per day, and vitamin D should be at least 1000 international units per day.    DEPRESSION  Depression is very common in patients with epilepsy. Talk with your doctor if you find that you are suffering from signs of depression.     PATIENT ADVOCACY, RESOURCES, COMMUNITY:  http://www.epilepsy.com  - the leading portal for people, caregivers and professionals dealing with epilepsy    SEIZURE DIARY  Patients with seizures should keep track each seizure: when did it happen, what happened, where there any triggers, etc. This can be easily done in a small notebook, a calendar, or a mobile phone. A very helpful, FREE resource for this is a website called Seizuretracker. They also include a mobile app for phones. https://seizuretracker.com/    HOME SEIZURE SAFETY and PRECAUTIONs:  *Please remember that certain things can trigger seizures: not getting enough sleep, infections/illness, strobe/flashing lights, traumas to the head, illicit drugs/alcohol, etc.    Patients at risk for seizures that involves loss of consciousness must avoid:  - Driving for at least 3-6 months (until re-evaluated by your doctor)   - Riding bike with no helmet   - unattended baths, unattended swimming  -  power tools, heavy machinery, or other such dangerous items  - ladders, high platforms, roofs  - any other activity that could pose a safety risk if there was a loss of consciousness or uncontrolled body movements    For any seizures involving loss of consciousness, FAMILY or BYSTANDER should do the following:  1. Don't panic - take a deep breath  2. Look at the clock to see what time the seizure started.  3. If the patient is shaking for more than 3 minutes - or has repeated seizures without returning to normal in between, call 911; this is an emergency.  4. As much as possible, move things away from the patient's head to avoid injury  5. Use pillows or towels around patient to protect the from injury  6. Put them on a bed, couch, or the floor ON THEIR SIDE. This is to avoid a condition called aspiration pneumonia, when vomit goes into the lungs.   7. DO NOT PUT ANYTHING IN THEIR MOUTH. Patients don't swallow their  tongues despite the old wives tale. However, putting things inside the mouth could lead to choking hazard.   8. Notify your regular doctor about the seizure

## 2015-08-23 NOTE — Progress Notes (Addendum)
[facility], Earlene Plater  Department of Neurology  [facility]    [address]  [address]  [place], [place] [address]  Phone:  [phone]  Fax:      [phone]    CLINIC NOTE - FOLLOW UP VISIT in the epilepsy clinic for intractable seizures    CHIEF COMPLAINT: Seizures     Referring provider:  Stephannie Peters, MD  [address]  [place], [place] [address]    Primary care provider:  Stephannie Peters, MD  [address]  [place] [address]    HISTORY:    Peter Deleon is a 55yr old right handed male who was last seen on in April 2017.  He has had seizures since 23 months of age a few months after having a high fever and strep throat.  He is currently happy with his seizure control with approximately 1 breakthrough seizure every 6 weeks.  He has a tendency to cluster his seizure activity in the past could have 8-10 seizures per day.      All seizure activity is stereotypical and described as a few minute aura of feeling unwell followed by oral at home and to symptoms, bilateral hand fidgeting, decreased spontaneous speech and impaired awareness with occasional disruptive outbursts of speech or motor activity.  Seizure activity last 20-30 seconds.  He has never had generalized tonic-clonic activity or rhythmic motor activity with seizures.    Current antiepileptic regimen:  Extended release valproic acid 2000 mg each bedtime  Extended release Keppra 1500 mg each bedtime  Zonisamide 400 mg each bedtime  0.5 mg clonazepam when necessary for breakthrough seizure activity, rarely used  VNS placed in 2012 -swiping his VNS magnet with cluster seizure activity is minimally effective    Previously tried antiepileptics:  Phenobarbital - first antiepileptic ever used  Lamotrigine  Dilantin   Tegretol  Mysoline     The patient does better with extended release medication formulation causing less daytime sleepiness.  He is obstructive sleep apnea and reliably uses his CPAP machine.  He also takes Provigil for  daytime fatigue.    Seizure history since last visit: The patient had been having his typical break through seizure activity every 6 weeks until he went on vacation to [place] and confused calcium tablets for Keppra.  He missed multiple Keppra dosages and started having clustered seizure activity with longer durations and decreased aura, he could have multiple seizures per hour without returning to baseline in between events.  He went to the local emergency department where his Keppra level was undetectable and he was loaded with IV Keppra in addition to receiving benzodiazepines.    Seizure triggers: Extremely hot environment, missing antiepileptic medications.    The patient has been in status epilepticus in the past.  He has never had significant injury from seizure activity.    Driving Status: The patient does not drive.    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 The patient was able to respond to questions and follow simple commands during some of the seizures and not during other times.  The interictal recording is significant for infrequent spikes and more frequent intermittent rhythmic slowing over the right hemisphere, maximal over  the frontal region.    Increased ammonia level in January 2017 of 86, valproic acid level at that time 40 (mildly low)    He reports having increased daytime sleepiness and depression were he finds it more difficult to get motivated to do daily activities.  If he has a specific task, he is able to accomplish this such as going to work.    Side effects to the current antiepileptic medications are: Mild upper extremity tremor.  The patient tolerates this and  is not interested in changing antiepileptic medications at this time.  He sees psychiatry for his bipolar disorder where his Abilify had been discontinued as a potential exacerbation of his tremor.    Family history: Negative for seizures.    Social history: The patient averages one glass of alcohol per week.  He quit smoking 15 years ago.  He denies any illicit drug use.  He ambulates without an assistive device.  He lives with his wife and has 3 healthy adult children.  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  As a child, the patient moved around a lot since his father was in the [employer].    Current Outpatient Prescriptions   Medication Sig Dispense Refill    Amlodipine (NORVASC) 2.5 mg Tablet Take 2.5 mg by mouth every day.      CALCIUM CARBONATE/VITAMIN D3 (CALCIUM + D PO)       ClonazePAM (KLONOPIN) 0.5 mg Tablet if needed.      Divalproex (DEPAKOTE ER) 500 mg Extended Release Tablet Take 4 tablets by mouth every day at bedtime. 360 tablet 11    LevETIRAcetam (KEPPRA XR) 750 mg SR 24hr Tablet Take 2 tablets by mouth every day. 180 tablet 11    Levocarnitine (CARNITOR) 330 mg Tablet Take 2 tablets by mouth 2 times daily with meals. 60 tablet 11    LevoTHYROxine (SYNTHROID) 75 mcg Tablet Take 75 mcg by mouth every morning before a meal.      Modafinil (PROVIGIL) 100 mg Tablet Take 1 tablet by mouth 2 times daily. 180 tablet 1    MULTIVITAMIN PO       NABUMETONE (RELAFEN PO) Take 750 mg by mouth 2 times daily.      Omeprazole (PRILOSEC) 20 mg Delayed Release Capsule Take 20 mg by mouth once daily before a meal.      Tamsulosin (FLOMAX) 0.4 mg Capsule Take 0.4 mg by mouth every day at bedtime.      VENLAFAXINE HCL (EFFEXOR PO) Take 150 mg by mouth every day at bedtime.      Zonisamide (ZONISAMIDE) 100 mg Capsule Take 4 capsules by mouth every day at bedtime. 360 capsule 11     Pcn (Penicillin) [Penicillins]    Rash  Vicodin [Hydrocodone-Acetaminophen]    Anxiety    past medical  history  Bipolar disorder  Intractable epilepsy   Osteoarthritis  Chronic back pain  Depression  BPH  GERD  OSA  Hypothyroidism  Hypertension    REVIEW OF SYSTEMS:  He reports impairment of short-term memory.    A 12-system ROS was performed and is negative except for those items noted in the HPI    EXAMINATION:  BP 130/85  Pulse 97  Temp 36.6 C (97.9 F) (Tympanic)  Resp 18  Ht 1.727 m (5\' 8" )  Wt 101.2 kg (223 lb 1.7 oz)  BMI 33.92 kg/m2  The general examination is unchanged.     NEUROLOGICAL EXAMINATION:  MENTAL STATUS: The patient is  alert and oriented times three.  Able to follow multistep commands without difficulty.    SPEECH: Fluent without dysarthria.    There is normal speech and language function.  CRANIAL NERVES: The pupils are equal, round and reactive to light and accommodation. The visual fields were full. The extraocular movements were intact. There was no evidence for facial asymmetry at rest or with facial expressions. Hearing was intact bilaterally and the tongue protrudes midline.  Bilateral symmetric shoulder shrugging ability.  MOTOR: No increased tone.  No pronator drift with ability to cross his arms and stand up from a seated position.  Mild bilateral upper extremity postural tremor, low amplitude and high frequency.  REFLEXES: 2+ throughout.  SENSORY: Normal light touch and cold temperature.  COORDINATION: No dysmetria with finger to nose to finger or heel-to-shin testing.  Intact rapid alternating movements.  GAIT: Appropriate stance, base, arm swing and turning ability.  Able to walk on his toes and heels and tandem walk.    ROMBERG: Negative.    Neurological Disorders Depression Inventory for Epilepsy (NDDI-E) Questionnaire:     Circle the number that best describes you over the past two weeks?   Everything is a struggle: 3   Nothing I do is right: 2   Feel guilty: 1   I'd be better off dead: 1   Frustrated: 4    Difficulty finding pleasure: 4     How many seizures have you had in  the last 1 month?  None   How many seizures have you had in the last 6 months?  80   How many generalized convulsions (grand-mal seizures) have you had any in the past year?  Never    (1= Never, 2 = Rarely, 3 = Sometimes, 4 = Always/Often)    Screening for Adverse Events in Epilepsy    Please circle the number that best describes problems you have had in the past 4 weeks:      Unsteadiness: 3    Tiredness:  4   Restlessness:  4   Feelings of aggression:  3    Nervousness and/or agitation:  3    Headache:  3    Hair loss: 1   Problems with skin:  1    Double or blurred vision: 3   Upset stomach:  3    Difficulty in concentrating:  4   Trouble with mouth or gums:  1    Shaky hands:  4    Weight gain:  2    Dizziness:  3    Sleepiness: 4    Depression:  3    Memory problems:  4   Disturbed sleep:  4       (1= Never, 2 = Rarely, 3 = Sometimes, 4 = Always/Often)     VNS interrogator with attempted adjustment, which the patient did not tolerate and the settings were returned.    Vagal Nerve Stimulator Settings:  0.25 milliamps output current          (when increased to 0.5 milliamps, the patient had significant throat pain and coughing-the previous setting once reapplied)  30 Hz signal frequency  250 s pulse width  30 seconds on time   5.0 minutes off time   0.25 milliamps Magnet output current        30 seconds Magnet on time  250 s Magnet pulse width  NO IFI    IMPRESSION   Adult male with intractable epilepsy on multiple antiepileptics and previous status  epilepticus.  He is unable to tolerate higher output current on his VNS.    The following tests were ordered:   -A DEXA scan has been ordered since the patient has never had one and he has been on antiepileptics since the each of 55 years old.  He does take supplemental calcium and vitamin D.  However, antiepileptics may speed up the rate of bone demineralization and the patient is at risk for early osteopenia/osteoporosis.  He may need more aggressive treatment for  bone health if warranted after the study.    -Bloodwork including CBC, BMP, and ammonia were ordered    -The patient's medications were electronically refilled.    Peter Deleon was scheduled for a return visit in 12 months.  He was told to call the clinic sooner if issues arise.        Risks, benefits, alternatives and side effects of the management were discussed in detail.  Once again, potential for epilepsy surgery was discussed and at this time the patient declines.    The patient was counseled about the following epilepsy specific patient safety issues: Supervision in bodies of water and avoiding dangerous situations such as heights were the patient could significantly injure himself if he was to have a seizure and fall.      The patient was seen, discussed, and examined with neurology attending Dr. Trilby DrummerSeyal.    This note was created using the support of Dragon Medical Dictation Software.  Please note any grammatical, sound-alike, or syntax errors as likely dictation errors.      Neurology Attending    This patient was seen, evaluated, and care plan was developed with the resident. I agree with the findings and plan as outlined in the resident's note above. The total visit time today was 20 minutes and more than 50% of the time was spent in face to face counseling the patient regarding the diagnosis and future management, reviewing records and other issues as noted above.    Teodora MediciMasud Seyal MD

## 2015-08-23 NOTE — Progress Notes (Signed)
Order given to patient to get the Dexa scan done locally.

## 2015-08-23 NOTE — Nursing Note (Signed)
Vital signs taken, allergies verified, screened for pain. Preferred pharmacy selected. Refills needed? No.Kareema Keitt MA II

## 2015-08-28 LAB — KEPPRA (LEVETIRACETAM): KEPPRA (LEVETIRACETAM): 10.9 ug/mL (ref 6.0–46.0)

## 2015-09-19 ENCOUNTER — Telehealth: Payer: Self-pay | Admitting: NEUROLOGY

## 2015-09-19 NOTE — Telephone Encounter (Signed)
Telephone call to patient, 3 pt identifiers used.  Pt reports he had one seizure a day over the last three days, triggers including stress and extreme heat (>110 degrees) and some interrupted sleep.  Pt denies any illness or missed or late doses of seizure medications.  Pt would like to know if MD has any concerns regarding pt having hernia repair surgery on 09/25/15 and there are any medication adjustments that need to be made to help prevent seizures.  Pt current seizure medications are Depakote ER 2000 mg qHS, Keppra XR 1500 mg daily, and zonisamide 400 mg qHS.  Please advise.    Domenica Fail, RN  Neurology

## 2015-09-19 NOTE — Telephone Encounter (Signed)
Patient called to speak to a nurse or Dr. Trilby Drummer in regards to some questions he has about surgery and seizures. Patient states he will be undergoing surgery on 09/25/2015 for hernia repair    Patient would like to speak to someone in our clinic before going forward with the surgery. Please advise.     Victorino Dike I. Garrel Ridgel II   Ne[facility]  Patient line: [phone]  Backline: [phone]

## 2015-09-20 NOTE — Telephone Encounter (Signed)
Patient called and is requesting a call back from suzette. Patient states he was speaking to suzette yesterday and was advised to call back this morning.     He states he will be available today until 12:30pm and the best way to contact him is his cell phone.    Peter Deleon   Neuroscience Clinic  Patient line: [phone]  Backline: [phone]

## 2015-09-20 NOTE — Telephone Encounter (Signed)
Spoke with Dr. Trilby Drummer.  He has no concerns for pt having hernia surgery.  Pt should take seizure meds up until point that surgeon wants pt to be NPO, then continue meds after surgery.  Relayed message to pt and he verbalizes understanding.    Lyda Perone Rahshawn Remo RN/neurology

## 2015-10-17 ENCOUNTER — Encounter: Payer: Self-pay | Admitting: NEUROLOGY

## 2015-10-25 ENCOUNTER — Other Ambulatory Visit: Payer: Self-pay | Admitting: NEUROLOGY

## 2015-10-25 DIAGNOSIS — R569 Unspecified convulsions: Principal | ICD-10-CM

## 2015-10-25 MED ORDER — MODAFINIL 100 MG TABLET
100.0000 mg | ORAL_TABLET | Freq: Two times a day (BID) | ORAL | 3 refills | Status: DC
Start: 2015-10-25 — End: 2015-12-27

## 2015-10-25 NOTE — Telephone Encounter (Signed)
This is a  REFILL AUTHORIZATION for the electronic refill request for Modafinil 100mg  tabs  prescribed by Dr. Trilby DrummerSeyal.  Medication refilled per Prescription Refill by Clinic RN Standardized Procedure Policy II-31.  Regino BellowStacey Nakayla Rorabaugh, RN-Neurology

## 2015-11-14 NOTE — Telephone Encounter (Addendum)
Prior authorization for Modafinil 100mg  Qty 60/30 days submitted to [employer]  ID #[id number]  Case: [id number]    Approved Valid 08/17/15-11/14/16      Gerilyn NestleLeah Brown, [facility]  [facility], [place]  [phone] patient line  [phone] backline

## 2015-12-27 ENCOUNTER — Other Ambulatory Visit: Payer: Self-pay | Admitting: NEUROLOGY

## 2015-12-27 DIAGNOSIS — G40919 Epilepsy, unspecified, intractable, without status epilepticus: Principal | ICD-10-CM

## 2015-12-27 DIAGNOSIS — R569 Unspecified convulsions: Secondary | ICD-10-CM

## 2015-12-27 MED ORDER — ZONISAMIDE 100 MG CAPSULE
400.0000 mg | ORAL_CAPSULE | Freq: Every day | ORAL | 11 refills | Status: DC
Start: 2015-12-27 — End: 2016-05-21

## 2015-12-27 MED ORDER — DIVALPROEX ER 500 MG TABLET,EXTENDED RELEASE 24 HR
4.0000 | EXTENDED_RELEASE_TABLET | Freq: Every day | ORAL | 11 refills | Status: DC
Start: 2015-12-27 — End: 2016-05-21

## 2015-12-27 MED ORDER — MODAFINIL 100 MG TABLET
100.0000 mg | ORAL_TABLET | Freq: Two times a day (BID) | ORAL | 1 refills | Status: DC
Start: 2015-12-27 — End: 2016-08-18

## 2015-12-27 MED ORDER — LEVETIRACETAM ER 750 MG TABLET,EXTENDED RELEASE 24 HR
1500.0000 mg | EXTENDED_RELEASE_TABLET | Freq: Every day | ORAL | 11 refills | Status: DC
Start: 2015-12-27 — End: 2016-05-21

## 2015-12-27 NOTE — Telephone Encounter (Signed)
Received telephone call from patient, 3 pt identifiers used.  Pt requesting his seizure medications be sent to new pharmacy [facility] because previous pharmacy has closed.    This is a  REFILL AUTHORIZATION for the telephone request for Depakote ER 500 mg tabs, Keppra XR 750 mg tabs, Zonisamide 100 mg caps, and Modafinil 100 mg tabs prescribed by Dr. Trilby DrummerSeyal.  Medication refilled per Prescription Refill by Clinic RN Standardized Procedure Policy II-31.    Last office visit 08/23/15.  Patient on wait list for next appointment.    Domenica FailSuzette Bevelyn Arriola, RN  Neurology

## 2016-02-01 ENCOUNTER — Other Ambulatory Visit: Payer: Self-pay | Admitting: NEUROLOGY

## 2016-02-01 DIAGNOSIS — R569 Unspecified convulsions: Principal | ICD-10-CM

## 2016-02-01 MED ORDER — LEVOCARNITINE 330 MG TABLET
660.0000 mg | ORAL_TABLET | Freq: Two times a day (BID) | ORAL | 11 refills | Status: DC
Start: 2016-02-01 — End: 2016-05-21

## 2016-02-01 NOTE — Telephone Encounter (Signed)
This is a  REFILL AUTHORIZATION for Levocarnitine ( Carnitor)330mg  tablet, take 2 tabs PO BID.  Pt's name and birthdate verified. Pt was last seen by Dr. Trilby DrummerSeyal on 08-23-15. Next appointment is not yet scheduled for f/u but on wait list. Last office visit and medication list reviewed. Prescription filled for #120, with 11 refills.  Prescription sent to [facility].  Medication refilled per Prescription Refill by Clinic RN Standardized Procedure Policy II-31  [facility] Dariyah Garduno,RN/Neurology

## 2016-05-21 ENCOUNTER — Telehealth: Payer: Self-pay | Admitting: NEUROLOGY

## 2016-05-21 DIAGNOSIS — R569 Unspecified convulsions: Secondary | ICD-10-CM

## 2016-05-21 DIAGNOSIS — G40919 Epilepsy, unspecified, intractable, without status epilepticus: Principal | ICD-10-CM

## 2016-05-21 MED ORDER — LEVOCARNITINE 330 MG TABLET
660.0000 mg | ORAL_TABLET | Freq: Two times a day (BID) | ORAL | 1 refills | Status: DC
Start: 2016-05-21 — End: 2017-01-12

## 2016-05-21 MED ORDER — ZONISAMIDE 100 MG CAPSULE
400.0000 mg | ORAL_CAPSULE | Freq: Every day | ORAL | 1 refills | Status: DC
Start: 2016-05-21 — End: 2017-01-05

## 2016-05-21 MED ORDER — LEVETIRACETAM ER 750 MG TABLET,EXTENDED RELEASE 24 HR
1500.0000 mg | EXTENDED_RELEASE_TABLET | Freq: Every day | ORAL | 1 refills | Status: DC
Start: 2016-05-21 — End: 2016-10-17

## 2016-05-21 MED ORDER — DIVALPROEX ER 500 MG TABLET,EXTENDED RELEASE 24 HR
4.0000 | EXTENDED_RELEASE_TABLET | Freq: Every day | ORAL | 1 refills | Status: DC
Start: 2016-05-21 — End: 2017-01-05

## 2016-05-21 NOTE — Telephone Encounter (Signed)
Received a call from the patient requesting all of his prescriptions be sent to [facility] pharmacy as a 90 day supply.        Peter Deleon  [facility] [place]   Clinic: [phone]  Back line: [phone]

## 2016-05-21 NOTE — Telephone Encounter (Signed)
This is a  REFILL AUTHORIZATION for the telephone 90 day supply request for Depakote ER 500 mg tablet, Keppra XR 750 mg tablet, Carnitor 330 mg tablet, and Zonisamide 100 mg capsule prescribed by Dr. Trilby Drummer.  Medication refilled per Prescription Refill by Clinic RN Standardized Procedure Policy II-31.  Last office visit 08/23/15.  Patient on wait list for next appointment.    Domenica Fail, RN  Neurology

## 2016-06-30 ENCOUNTER — Telehealth: Payer: Self-pay | Admitting: NEUROLOGY

## 2016-06-30 NOTE — Telephone Encounter (Signed)
Received a call from patient requesting all current prescriptions be sent to [facility]. Mailing address: [address] [place], [place] [address] [place], Ph: [phone] Fx:[phone] and International Fx: [phone]. Patient will call back with Pharmacy Acct #.    Lysbeth PennerAnastasia Stpehen Petitjean, [facility] III  [facility] [place]   Clinic: [phone]  Back line: [phone]

## 2016-06-30 NOTE — Telephone Encounter (Signed)
Received a call from the patient in regards to previous message, patient advised he does not have an account number the information can be found through his name and date of birth.          [facility] Hospital[facility]issa Yechezkel Fertig   [facility] [place] [facility]  [phone]

## 2016-07-16 ENCOUNTER — Telehealth: Payer: Self-pay | Admitting: NEUROLOGY

## 2016-07-16 NOTE — Telephone Encounter (Signed)
Received phone call from patient, checking status on forms for medication Depakote for assistance program. Patient mailed forms for Dr.Seyal to complete. Patient would like to know status.    Peter Deleon  [facility] III  [facility] [place]

## 2016-07-22 NOTE — Telephone Encounter (Signed)
Form is completed this AM and just needing signature.  Placed in Dr. Patrici RanksSeyal's box.  Let pt know.  He would like the form mailed to him once signed.    Lyda PeroneKirk Ritchie Klee RN/neurology

## 2016-08-18 ENCOUNTER — Other Ambulatory Visit: Payer: Self-pay | Admitting: NEUROLOGY

## 2016-08-18 DIAGNOSIS — R569 Unspecified convulsions: Principal | ICD-10-CM

## 2016-09-04 ENCOUNTER — Ambulatory Visit: Payer: Medicare Other

## 2016-09-04 ENCOUNTER — Ambulatory Visit: Payer: Medicare Other | Attending: NEUROLOGY | Admitting: NEUROLOGY

## 2016-09-04 VITALS — BP 120/78 | HR 88 | Temp 98.3°F | Resp 14 | Ht 68.0 in | Wt 215.4 lb

## 2016-09-04 DIAGNOSIS — G40109 Localization-related (focal) (partial) symptomatic epilepsy and epileptic syndromes with simple partial seizures, not intractable, without status epilepticus: Principal | ICD-10-CM

## 2016-09-04 LAB — VALPROATE: VALPROATE: 57 mg/L (ref 50–100)

## 2016-09-04 LAB — AMMONIA: AMMONIA: 50 umol/L — AB (ref 2–30)

## 2016-09-04 NOTE — Nursing Note (Signed)
Vital signs taken, allergies verified, screened for pain. Preferred pharmacy verified.  Medication refills needed yes.  Ashna Dorough, MA

## 2016-09-04 NOTE — Progress Notes (Addendum)
Raleigh Nation, MD    PATIENT:  Peter Deleon  MRN:         [id number]  DATE:        09/04/2016    Dear Dr. Carron Curie:    Our mutual patient, Merle Cirelli, was seen today at the [facility] in follow-up for focal epilepsy.  A [place] interpreter was not used during this encounter.    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 The patient last visit was in 2017, since that time he states that he has had seizures that are good control. Since that time the patient states that he has had a single seizure when [place] -had a lack of sleep and fatigue and stress, and feels his medications were on track. This was a typical aura for the patient- where the patient has a sense of "dream sense" aura that is followed by left arm clonic movement- at that time he is unable to talk or follow commands, however is aware of his surroundings but unable to respond. This lasts roughly few minutes and then takes 20 minutes to regain communication skills. The patient states that he does at times have auras however is able to use the magnet to abruptly stop the feeling. The patient states that he has not had a series of seizures at this time as he has in the years past and he feels that he has had appropriate seizure control for the past 2-3 years. Prior that they were occurring monthly.     Current antiepileptic regimen:  Extended release valproic acid 2000 mg each bedtime  Extended release Keppra 1500 mg each bedtime  Zonisamide 400 mg each bedtime  0.5 mg clonazepam when necessary for breakthrough seizure activity, rarely used  VNS placed in 2012 -swiping his VNS magnet      Previously tried antiepileptics:  Phenobarbital - first antiepileptic ever used  Lamotrigine  Dilantin    Tegretol  Mysoline      The patient has had a V[facility] Admission that demonstrated  metallic taste in his mouth, and then his typical seizure semiology of initial aura, followed by inability to speak, decreased interactiveness, looking around/hyperkinetic movements (tends to focus on left hand), and finally oral automatisms. At times there are left hand clonic movements and decreased strength or neglect of left side during the seizures. They lasted 1.5 to 2.5 minutes with impairment in consciousness as evidenced by an inability to follow commands. The EEG at the time demonstrated alpha activity is seen over the right hemisphere, maximal over the temporal region. After approximately 20 seconds, this activity evolves this activity evolves to a rhythmic theta activity with superimposed beta frequencies over the right hemisphere and slows to a higher amplitude 3-4 frequency after an additional 20-30 seconds and then continues to slpw to 1-2 Hz and 1 Hz before ending abruptly approximately 1:45-2 minutes after onset    MEDICATIONS:  Current Outpatient Prescriptions on File Prior to Visit   Medication Sig Dispense Refill    Amlodipine (NORVASC) 2.5 mg Tablet Take 2.5 mg by mouth every day.      CALCIUM CARBONATE/VITAMIN D3 (CALCIUM + D PO)       ClonazePAM (KLONOPIN) 0.5 mg Tablet if needed.      Divalproex (DEPAKOTE ER)  500 mg Extended Release Tablet Take 4 tablets by mouth every day at bedtime. 360 tablet 1    LevETIRAcetam (KEPPRA XR) 750 mg SR 24hr Tablet Take 2 tablets by mouth every day. 180 tablet 1    Levocarnitine (CARNITOR) 330 mg Tablet Take 2 tablets by mouth 2 times daily with meals. 360 tablet 1    LevoTHYROxine (SYNTHROID) 75 mcg Tablet Take 75 mcg by mouth every morning before a meal.      Modafinil (PROVIGIL) 100 mg Tablet TAKE ONE TABLET BY MOUTH TWICE DAILY 180 tablet 4    MULTIVITAMIN PO       NABUMETONE (RELAFEN PO) Take 750 mg by mouth 2 times daily.      Omeprazole (PRILOSEC) 20 mg Delayed  Release Capsule Take 20 mg by mouth once daily before a meal.      Tamsulosin (FLOMAX) 0.4 mg Capsule Take 0.4 mg by mouth every day at bedtime.      VENLAFAXINE HCL (EFFEXOR PO) Take 150 mg by mouth every day at bedtime.      Zonisamide (ZONISAMIDE) 100 mg Capsule Take 4 capsules by mouth every day at bedtime. 360 capsule 1     No current facility-administered medications on file prior to visit.        EXAMINATION:    Vitals:  Temp: 36.8 C (98.3 F) (07/19 1421)  Temp src: Tympanic (07/19 1421)  Pulse: 88 (07/19 1422)  BP: 120/78 (07/19 1422)  Resp: 14 (07/19 1421)  SpO2: --  Height: 172.7 cm (5\' 8" ) (07/19 1421)  Weight: 97.7 kg (215 lb 6.2 oz) (07/19 1421)    General Physical Exam:  General:  No acute distress, Resting Comfortably   HEENT:  Moist mucous membranes, Anicteric sclera  Cardiovascular:  Regular rate, and rhythm, no mumurs rubs or gallops   Respiratory:  Clear to auscultation bilaterally, no wheezes rales or rhonchi  Abdominal:  Soft non tender, non distended   Rectal / GU:  Deferred  Extremities:  +2 distal radial pulse  Skin:  No cyanosis or edema    Neuro Exam:    Mental Status:   MMSE:  Alert to person, place and time. Conversing appropriately   Use of language:   overall appropriate use of language, fluent, comprehension intact    Cranial Nerves:  CN 2:        Pupils reactive.  Visual fields intact in all four quadrants.    CN 3,4,6:  EOM intact  CN 7:        Facial symmetric  Eye closure strength 5/5  CN 8:        Hearing intact bilaterally    Motor:  Tone and bulk:  Normal bulk and tone, no signs of cogwheel rigidity   Adventitious movements:  None   Pronator drift:  Negative      Deltoid Biceps Triceps BR Wrist Flexors Wrist Extensors Finger Abductors Grip   R 5 5 5 5  - - - 5   L 5 5 5 5  - - - 5      Hip Flexors Hip Ext Leg Abduct Leg Adduct Knee Flexors Knee Ext Dorsi- Flexion Plantar- Flexion   R 5 5 - - 5 5 5 5    L 5 5 - - 5 5 5 5      Reflexes:     Biceps-C5 BR-C6 Triceps-C7 Patellar-L4  Achilles-S1 Plantar Resp   R +2 +2 +2 +2 +2 Mute    L +2 +2 +2 +2 +2 Mute  Sensory:  Light touch:  Intact and symmetric in all four extremities       Coordination:  F-to-N:  Intact    Cortical:  Extinction on DSS (neglect):  None    DIAGNOSTIC STUDIES:  07/2014 vEEG  This 5 day epilepsy monitoring unit admission captured 2 aura  without EEG correlate and 5 seizures seizures arising over the  right hemisphere. The patient's clinical semiology consisted of an difficult to describe aura then evolving to oral automatisms  and hand automatisms. The patient was able to respond to questions and follow simple commands during some of the seizures  and other seizure or portions of seizures, he was not able to  respond or follow commands. The interictal recording is   significant for infrequent spikes and more frequent intermittent rhythmic slowing over the right hemisphere, maximal over the frontal region.     MRI Brain 2015  MR CONSISTENT WITH PROBABLE CONGENITAL ARCHITECTURAL ARRAY DISTORTION OFTHE RIGHT HIPPOCAMPUS AND AMYGDALA. 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 The patient at this time has been compliant with his medication- he states that he has only had a single seizure in the past year, and for the past 3 years had averaged 1-2 seizures per year which is dramatically improved from prior history in which the patient has clustered. The patient is currently on zonisamide, valproate, and keppra that has demonstrated very good control. Today we will check levels. The patient has been taking calcium and vitamin D. The patient may follow up in one year. The patient has plans to potentially move to [place] in the  next year, however will be attempting to contact neurologist in [facility] to follow up.     PLAN / RECOMMENDATIONS:   - Check ammonia, valproate, zonisamide level today  - Continue AEDs  Extended release valproic acid 2000 mg each bedtime  Extended release Keppra 1500 mg each bedtime  Zonisamide 400 mg each bedtime  Continue Levocarnitine      Return to clinic in 12 month(s).    Patient was seen and discussed with attending Dr. Trilby Drummer who agrees with the above stated findings and plan of care.     Sincerely,  Roylene Reason, MD  Resident  Department of Neurology    Report electronically signed by:  Roylene Reason, MD      Neurology Attending    This patient was seen, evaluated, and care plan was developed with the resident. I agree with the findings and plan as outlined in the resident's note above. The total visit time today was 20 minutes and more than 50% of the time was spent in face to face counseling the patient regarding the diagnosis and future management, reviewing records and other issues as noted above.    Teodora Medici MD

## 2016-09-05 LAB — ZONISAMIDE: ZONISAMIDE: 10 ug/mL (ref 10–40)

## 2016-09-10 LAB — KEPPRA (LEVETIRACETAM): KEPPRA (LEVETIRACETAM): 9.8 ug/mL (ref 6.0–46.0)

## 2016-09-18 ENCOUNTER — Telehealth: Payer: Self-pay | Admitting: NEUROLOGY

## 2016-09-18 NOTE — Telephone Encounter (Signed)
Received telephone call from patient, 3 pt identifiers used.  Pt given lab results from 09/04/16, pt verbalized understanding.  Pt would like to know if MD has any recommendations regarding elevated Ammonia level which he is still concerned about.  Please advise.    Domenica FailSuzette Aasim Restivo, RN  Neurology

## 2016-09-19 NOTE — Telephone Encounter (Signed)
Please let patient know no concerns about ammonia levels that are a consequence of his being on Depakote. We will continue to monitor ammonia levels.    Peter Deleon

## 2016-09-19 NOTE — Telephone Encounter (Signed)
Telephone call to patient, 3 pt identifiers used.  Relayed message below to pt.  Pt states he is still concerned because his level is still elevated and he would like to know if he can come off of the Depakote.  States he still gets very sleepy.  Please advise.  Pt requesting sooner appointment and to be placed on cancellation list to discuss concerns with MD.    Domenica FailSuzette Bijon Mineer, RN  Neurology

## 2016-10-06 ENCOUNTER — Telehealth: Payer: Self-pay | Admitting: NEUROLOGY

## 2016-10-06 NOTE — Telephone Encounter (Signed)
Call from pt reporting he had 5 seizures in a row on Saturday.  They were his typical focal seizures.    He had only 1.5 hours sleep a few nights before which he thinks was the trigger for this seizure.  No illnesses.  No missed meds.    Current meds: Extended release valproic acid 2000 mg each bedtime     Extended release Keppra 1500 mg each bedtime     Zonisamide 400 mg each bedtime     Levocarnitine 660 mg BID.    He would like to discuss with Dr. Trilby Drummer at next office visit in November getting off of Valproic acid as he is not comfortable with his elevated ammonia levels over the years.      Lyda Perone Emalee Knies RN/neurology

## 2016-10-17 ENCOUNTER — Telehealth: Payer: Self-pay | Admitting: NEUROLOGY

## 2016-10-17 MED ORDER — LEVETIRACETAM ER 750 MG TABLET,EXTENDED RELEASE 24 HR
EXTENDED_RELEASE_TABLET | ORAL | 11 refills | Status: DC
Start: 2016-10-17 — End: 2017-01-05

## 2016-10-17 NOTE — Telephone Encounter (Addendum)
Spoke to Patient's wife.   Identifier's utilized x 3.     Wife states yesterday he was really not feeling well. Seemed very confused.   [facility].     Labs at ED were concerning.   Previous lab 09/04/16 ammonia was 50  Yesterday lab was 64    Depakote level ws 42.     Changes made in ED:  1.  Keppra XR is  750 mg BID  2. Depakote was decreased from 500 mg tablets four times a day to 500 mg tablets three times a day.     They were told to follow up with neurology today. Advised I will text page Dr Trilby DrummerSeyal with critical ammonia levels.   They are to start laxatives ( not sure what kind to decrease ammonia level )    Please call to [phone] as family if VERY anxious.   Thank you,   Roanna BanningJennifer Claretta Kendra, RN      Oct 17, 2016 11:30 am.  Spoke with patient and his wife. They are very concerned about his ammonia levels (64) and feel that this is contributing to his sleepiness. Was seen in ED in [place] and Keppra XR 750 mg increased to 3 tabs daily. Also was instructed to decrease Depakote by one tablet daily.  He will continue on this dose of Keppra. Instructed him to decrease Depakote by one pill each week until completely off Depakote.  Will call back if symptoms do not improve.  Teodora MediciMasud Seyal MD

## 2016-11-06 NOTE — Telephone Encounter (Signed)
Call from pt re possible side effects to increased dose of Keppra XR 750 mg, recently increased to 3 tabs daily.    He reports lightheadedness/dizziness after increase Keppra ~2 weeks ago.    Pt took just 2 tabs of Keppra yesterday and reported no dizziness.    Pt also takes Zonisamide 400 mg QHS.  He is completely off of Depakote now.    Pt wants to know if it is OK if he continues with just 2 tablets of Keppra XR 750 mg daily + Zonisamide 400 mg QHS.    Lyda Perone Amar Sippel RN/neurology

## 2016-11-07 NOTE — Telephone Encounter (Signed)
Spoke with pt and reviewed options given by Dr. Kyung Rudd.    Pt prefers to continue take Keppra XR 1500 mg daily and Zonisamide 400 mg daily for the time being, understanding that there is a higher risk of seizure occurring.  He does have  current, unexpired Clonazepam available.    Lyda Perone Grae Cannata RN/neurology

## 2016-11-07 NOTE — Telephone Encounter (Signed)
He can take to lower dose of Keppra if that improves his symptoms. However, there is a higher risk of seizure(s) occurring with the reduced dose of Keppra, especially now that he has tapered off divalproex (depakote).     He could continue to take:  Keppra XR 1500 mg daily and Zonisamide (Zonegran) 400 mg daily    Or     He could increase Zonisamide mg to reduce chances of a seizure:  Keppra XR 1500 mg daily and Zonisamide (Zonegran) 500 mg daily    He has been prescribed clonazepam PRN seizure. Given the changes, he should make sure he has unexpired medication available and on hand in case a seizure occurs.

## 2016-11-07 NOTE — Telephone Encounter (Signed)
Received a call from the patient in regards to previous message.        Peter Deleon   [facility]  Patient Line: [phone]  Back Line: [phone]

## 2016-12-17 ENCOUNTER — Telehealth: Payer: Self-pay | Admitting: NEUROLOGY

## 2016-12-17 NOTE — Telephone Encounter (Signed)
Received approval from patient Drug Plan [employer] under authorization# [id number] with effective dates 09/18/2016-12/17/2017. Patient Pharmacy has been inform of the approval.    Peter PennerAnastasia Laverna Dossett, [facility]  [facility]   Clinic: [phone]  Back line: [phone]

## 2016-12-17 NOTE — Telephone Encounter (Signed)
Received Prior Authorization request from Pharmacy for Modafinil 100MG  OR TABS. Patient with Drug Plan [employer]  and Member Rx ID# [id number].  PA request submitted thru via www.covermymeds.com, KEY#. [id number]    Glenetta BorgAnastasia Sandra Tellefsen, [facility]  [facility] [address] [place]

## 2017-01-05 ENCOUNTER — Ambulatory Visit: Payer: Medicare Other | Attending: NEUROLOGY | Admitting: NEUROLOGY

## 2017-01-05 ENCOUNTER — Encounter: Payer: Self-pay | Admitting: NEUROLOGY

## 2017-01-05 DIAGNOSIS — Q049 Congenital malformation of brain, unspecified: Secondary | ICD-10-CM

## 2017-01-05 DIAGNOSIS — G40919 Epilepsy, unspecified, intractable, without status epilepticus: Principal | ICD-10-CM | POA: Insufficient documentation

## 2017-01-05 DIAGNOSIS — R569 Unspecified convulsions: Secondary | ICD-10-CM

## 2017-01-05 MED ORDER — ZONISAMIDE 100 MG CAPSULE
400.0000 mg | ORAL_CAPSULE | Freq: Every day | ORAL | 5 refills | Status: AC
Start: 2017-01-05 — End: 2018-01-05

## 2017-01-05 MED ORDER — LEVETIRACETAM ER 750 MG TABLET,EXTENDED RELEASE 24 HR
1500.0000 mg | EXTENDED_RELEASE_TABLET | Freq: Every day | ORAL | 5 refills | Status: DC
Start: 2017-01-05 — End: 2017-02-20

## 2017-01-05 NOTE — Progress Notes (Signed)
[employer], Earlene Plater  Department of Neurology  [facility]  [address]   [place], [place] [address]  Phone: [phone]  Fax: [phone]    CLINIC NOTE - INITIAL VISIT      CHIEF COMPLAINT: Seizures      Referring provider:  Keenan Deleon  [address]  [place], [place] [address]    Primary care provider:  Keenan Deleon  [address]  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 He was last seen in Epilepsy clinic by Drs. Wonda Olds and Seyal on 09/04/2016.       Interval History:  Since the last visit, Peter Deleon suffered worsening fatigue and confusion in the setting of high ammonia levels thought to be related to Depakote. He has since been taken off of this medication with last dose taken in September. His recent ammonia level checked back in [place] was within limits of normal, and patient reports his symptoms from elevated have since resolved. He remains on Carnitor while on his antifungal medication, which apparently may also elevate the ammonia levels.     Overall his seizure frequency has not changed since the last visit. He continues to have 1-3 day long clusters of having 1-3 seizures per day. These clusters occur approximately every 6-8 weeks. Just last week, however patient had about 5 seizures over 2 days, in the setting of getting only 1.5 hrs of sleep the previous night.     He is moving to [place] in January, and looking for an epileptologist who can take over is care. He is requesting paper prescriptions of his AEDs he can take with him once he finds a pharmacy.       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 He may or may not lose awareness. Last 20-30 seconds.     Current antiepileptic regimen:   Levetiracetam XR 1500 mg qHS   Zonisamide 400 mg qHS   Valproic acid XR 2000 mg QHS--> recently tapered off      Side effects to the current antiepileptic medications are: None    Rescue regimen: Clonazepam 0.5 mg PRN- He has been taking PRN clonazepam approximately 1 tab per month.     Previously tried antiepileptics (carried over from prior notes):  "- Phenobarbital :discontinued due to intolerance (although patient does not know exactly why)  - Dilantin: unclear why this was discontinued   - Tegretol: ineffective at high doses   - Mysolin: ineffective, stopped while they were trying to simplify his regimen  - Lamictal: unclear why it was discontinued "      Epilepsy Risk Factors:   Complication of birth  or early development No. Significant head trauma: No  Febrile convulsion: No. CNS infection: No Family history of epilepsy: No. Brain tumor: No. Stroke: No. Prior neurosurgery: No      Driving Status: Not driving/no valid license      PREVIOUS EVALUATIONS:    EEG:   VET 07/10/14-07/14/14, Reviewed by Dr. Kyung RuddKennedy: 2 auras w/o EEG correlation; 5 right hemispheric onset seizures. Clinically, patient had an aura he was unable to describe, evolving into automotor behavior w/wo aphasia. Interictally patient had focal slowing and epileptiform activity over the right hemisphere which was maximum over the frontal region.     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      CALCIUM CARBONATE/VITAMIN D3 (CALCIUM + D PO)       ClonazePAM (KLONOPIN) 0.5 mg Tablet if needed.      Divalproex (DEPAKOTE ER) 500 mg Extended Release Tablet Take 4 tablets by mouth every day at bedtime. 360 tablet 1    LevETIRAcetam (KEPPRA XR) 750 mg SR 24hr Tablet Three po daily 90 tablet 11    Levocarnitine (CARNITOR) 330 mg Tablet Take 2 tablets by mouth 2 times daily with meals. 360 tablet 1    LevoTHYROxine (SYNTHROID) 75 mcg Tablet Take 75 mcg by mouth every morning before a meal.      Modafinil (PROVIGIL) 100 mg Tablet TAKE ONE TABLET BY MOUTH TWICE DAILY 180 tablet 4    MULTIVITAMIN PO       NABUMETONE (RELAFEN PO) Take 750 mg by mouth 2 times daily.      Omeprazole (PRILOSEC) 20 mg Delayed Release Capsule Take 20 mg by mouth once daily before a meal.      Tamsulosin (FLOMAX) 0.4 mg Capsule Take 0.4 mg by mouth every day at bedtime.      VENLAFAXINE HCL (EFFEXOR PO) Take 150 mg by mouth every day at bedtime.      Zonisamide (ZONISAMIDE) 100 mg Capsule Take 4 capsules by mouth every day at bedtime. 360 capsule 1     No current facility-administered medications for this visit.               Allergies   Allergen Reactions    Pcn (Penicillin) [Penicillins] Rash    Vicodin [Hydrocodone-Acetaminophen] Anxiety         FAMILY HISTORY:  No family history on file.      SOCIAL HISTORY:   Social History     Socioeconomic History    Marital status: MARRIED     Spouse name: Not on file    Number of children: Not on file    Years of education: Not on file    Highest education level: Not on file   Social Needs    Financial  resource strain: Not on file    Food insecurity - worry: Not on file    Food insecurity - inability: Not on file    Transportation needs - medical: Not on file    Transportation needs - non-medical: Not on file   Occupational History  Not on file   Tobacco Use    Smoking status: Former Smoker    Smokeless tobacco: Former NeurosurgeonUser    Tobacco comment: Quit cigs 13 years ago, quit smokeless tobacco 30 years ago   Substance and Sexual Activity    Alcohol use: Not on file    Drug use: Not on file    Sexual activity: Not on file   Other Topics Concern    Not on file   Social History Narrative    Not on file   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 Denies alcohol or illicit drugs  Lives with wife and children. [place].       EXAMINATION:  There were no vitals taken for this visit.  GENERAL APPEARANCE: The patient is well appearing and in no apparent distress.    HEENT: There are no facial dysmorphic features. The head is atraumatic and normocephalic. The mucous membranes are moist.  SKIN: There were no stigmata for neurocutaneous disorders.   NECK: The neck is supple and non-tender.  LUNGS: Clear to auscultation bilaterally.  CARDIOVASCULAR: Regular rate and rhythm. No murmur or carotid bruit.  SPINE: The spine is nontender to palpation.  EXTREMITIES: Normal, warm, no cyanosis or clubbing. No edema and nontender.      NEUROLOGICAL EXAMINATION:  MENTAL STATUS: The patient is alert and oriented times three. There is normal speech and language function.  CRANIAL NERVES: The pupils are equal, round and reactive to light and accommodation. The visual fields were intact to confrontation. The extraocular movements were normal. Fatiguing nystagmus with horizontal gaze initially. There was no evidence for facial asymmetry. Hearing was normal bilaterally and the tongue and palate were in the midline.  Sternocleidomastoid and upper trapezius strength was normal.  MOTOR: Muscle tone examination showed normal tone and bulk within the upper and lower extremities. Muscle strength testing revealed 5/5 power within the upper and lower extremities. Deep tendon reflexes were 2/4throughout.   SENSORY: Normal light touch, temperature, and vibration sense.  COORDINATION: Mild postural tremor noted (chronic per patient). There was no dysmetria seen on finger-nose-finger testing. Rapid alternating movements were normal bilaterally.   GAIT: The gait was normal including tandem.       LABS:  Results for Oneita JollyDAKIS, Peter (MRN [id number]) as of 01/05/2017 14:21   Ref. Range 09/04/2016 15:45   KEPPRA (LEVETIRACETAM) Latest Ref Range: 6.0 - 46.0 MCG/ML 9.8   VALPROATE Latest Ref Range: 50 - 100 mg/L 57   ZONISAMIDE Latest Ref Range: 10 - 40 ug/mL 10       Vagal Nerve Stimulator Interrogation, 01/05/2017   Settings  Output current:  0.25 mA  Signal frequency:   30 Hz  Pulse width:  250 uSec  On time:  30 sec    Off time:  5 min     Magnet triggered current:  0.25 mA  Magnet on time:  30 sec  Magnet pulse width:  250 uSec    System Diagnostics  Lead impedence:  OK, 2759 Ohms  Output current:  0.25 mA    Generator Battery: OK 50-75%      Vagal Nerve Stimulator Interrogation, 01/05/2017 - programming    Settings  Output current:  0.25 mA -> 0.5 mA  Signal frequency:   30 Hz  Pulse width:  250 uSec  On time:  30 sec    Off time:  5 min     Magnet triggered current:  0.25 mA --> 0.5 mA  Magnet on time:  30 sec  Magnet pulse width:  250 uSec    * Patient did not tolerate new settings, thus previous settings resumed as below:   Vagal Nerve Stimulator Interrogation, 01/05/2017 - programming    Settings  Output current:  0.5 mA -> 0.25 mA  Signal frequency:   30 Hz  Pulse width:  250 uSec  On time:  30 sec    Off time:  5 min     Magnet triggered current:  0.5 mA --> 0.25 mA  Magnet on time:  30 sec  Magnet pulse width:  250 uSec      Patient was  seen together with the epilepsy clinic nursing staff.         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 He has a VNS placed on 10/18/2010 with partial improvement in seizure frequency.     Patient continues to have clusters of brief focal seizures every 6-8 weeks, which is consistent with his typical seizure frequency. We discussed different pharmacological options to optimize his seizure control, however patient reports he is currently satisfied with his level of control, and would like to hold off on making any changes right before his move to [place]/transition of care to a different provider.     During this clinic visit, we did try to increase the output current of his VNS from 0.25-0.5 mA, and unfortunately patient did not tolerate the new settings. We discussed decreasing the pulse width, but patient wished not to make any further changes before the move/transition of care.     He did ask me to write a paper script for his AEDs in order to make it easier for him refill his medications once selects his new pharmacy.     PLAN  - Continue current Medication Regimen:    Levetiracetam XR 1500 mg qHS (paper script provided to patient)   Zonisamide 400 mg qHS (paper script provided to patient)    Clonazepam 0.5mg  PRN for prolonged or clusters of seizures   - Continue current VNS settings   - Advised patient to continue vitamin D and Calcium supplements given risk for bone mineral density loss related to chronic AED use.     Peter Deleon will be transitioning care to a new provider in [place].           Risks, benefits, alternatives and side effects of the management were discussed in detail.       The following specific issues were  discussed: We discussed pharmacologic options to optimize seizure control. We also discussed options regarding VNS settings. Standard seizure precautions discussed with patient.       [name] and wife agreed to the above plan.      I spent 60 minutes in this visit, with more than 50% devoted to patient counseling.  More than 15 minutes were spend programming the VNS.       Enis Gash, MD  Health Sciences Assistant Clinical Professor  Department of Neurology  [facility], [facility]    Report electronically signed by:  Enis Gash, MD

## 2017-01-05 NOTE — Nursing Note (Signed)
Vital signs taken, allergies verified, screened for pain. Preferred pharmacy selected. Refills needed? YES.Charl Wellen MA II

## 2017-01-12 ENCOUNTER — Other Ambulatory Visit: Payer: Self-pay | Admitting: NEUROLOGY

## 2017-01-12 DIAGNOSIS — R569 Unspecified convulsions: Principal | ICD-10-CM

## 2017-01-13 NOTE — Telephone Encounter (Signed)
Received electronic refill request for Modafinil & Levocarnitine.  Pended to Dr. Trilby DrummerSeyal for electronic signature.          Lyda PeroneKirk Hoyt Leanos RN/neurology

## 2017-01-15 ENCOUNTER — Ambulatory Visit: Payer: Medicare Other | Admitting: NEUROLOGY

## 2017-02-13 ENCOUNTER — Telehealth: Payer: Self-pay | Admitting: NEUROLOGY

## 2017-02-13 NOTE — Telephone Encounter (Signed)
Received a call from the patient advising that since his last office visit, he has had multiple seizures. He discontinued Depakote for a while and stated he was feeling fine till recently. He wants to know if the doctor can increase the dosage of Keppra and or Zonisamide to help with his seizures. He is requesting a call back to see what is the best option.        Peter Deleon Peter Deleon  Neuro[facility]  Patient Line: [phone]  Back Line: [phone]

## 2017-02-18 NOTE — Telephone Encounter (Signed)
Please advise as to thoughts on stopping depakote and inceasing keppra or zonisamide.   Thank you.   Roanna BanningJennifer Hasana Alcorta, RN

## 2017-02-18 NOTE — Telephone Encounter (Signed)
Received a call form the patient in regards to previous messages. Relayed the message to him about increasing Keppra. He agrees to it. The pharmacy he wants the medication to sent to is the [facility].      [facility]  [address]  [place] [phone]      Serena ColonelManveer Singh  [facility]  Patient Line: [phone]  Back Line: [phone]

## 2017-02-18 NOTE — Telephone Encounter (Signed)
Received a call form the patient in regards to previous messages. Relayed the message to him about increasing Keppra. He agrees to it. The pharmacy he wants the medication to sent to is the [facility].      [facility]  [address]  [place], [place]  [place] [phone]      Theresa Dohrman  [facility]  Patient Line: [phone]  Back Line: [phone]

## 2017-02-18 NOTE — Telephone Encounter (Signed)
Plan to increase Keppra to 2000mg  qHS from 1500mg . Did patient mention which pharmacy he would like us to send the medication? He is in the process of relocating, and asked for a paper script during the last clinic visit.     Thanks,     Enis GashKatherine Shatyra Becka, MD

## 2017-02-20 ENCOUNTER — Other Ambulatory Visit: Payer: Self-pay | Admitting: NEUROLOGY

## 2017-02-20 MED ORDER — LEVETIRACETAM ER 750 MG TABLET,EXTENDED RELEASE 24 HR
1500.0000 mg | EXTENDED_RELEASE_TABLET | Freq: Every day | ORAL | 5 refills | Status: DC
Start: 2017-02-20 — End: 2017-02-25

## 2017-02-20 NOTE — Telephone Encounter (Signed)
This is a  REFILL AUTHORIZATION for the telephone request for Levetiracetam 750 mg SR tablet prescribed by Dr. Willaim BanePark.  Medication refilled per Prescription Refill by Clinic RN Standardized Procedure Policy II-31.  Last office visit 01/05/17.  Patient on wait list for next appointment.    Domenica FailSuzette Tejas [place], RN  Neurology

## 2017-02-24 ENCOUNTER — Other Ambulatory Visit: Payer: Self-pay | Admitting: NEUROLOGY

## 2017-02-24 DIAGNOSIS — R569 Unspecified convulsions: Principal | ICD-10-CM

## 2017-02-25 ENCOUNTER — Telehealth: Payer: Self-pay | Admitting: NEUROLOGY

## 2017-02-25 DIAGNOSIS — G40919 Epilepsy, unspecified, intractable, without status epilepticus: Principal | ICD-10-CM

## 2017-02-25 MED ORDER — LEVETIRACETAM ER 750 MG TABLET,EXTENDED RELEASE 24 HR
1500.0000 mg | EXTENDED_RELEASE_TABLET | Freq: Every day | ORAL | 3 refills | Status: AC
Start: 2017-02-25 — End: 2018-02-25

## 2017-02-25 MED ORDER — LEVETIRACETAM ER 500 MG TABLET,EXTENDED RELEASE 24 HR
500.0000 mg | EXTENDED_RELEASE_TABLET | Freq: Every day | ORAL | 3 refills | Status: AC
Start: 2017-02-25 — End: 2018-02-20

## 2017-02-25 NOTE — Telephone Encounter (Signed)
Telephone call to patient, 3 pt identifiers used.  Pt confirmed he has been taking Keppra two 750 mg XR tablets with one 500 mg DR tablet which was previously prescribed for total of 2000 mg qHS.  States he needs Rx for Keppra XR 500 mg tablets.  Rx sent.     Per previous telephone encounter 02/13/17, "Plan to increase Keppra to 2000mg  qHS from 1500mg ." which pt was agreeable to.     Domenica FailSuzette Greg Eckrich, RN  Neurology

## 2017-02-25 NOTE — Telephone Encounter (Signed)
Received a call from the patient in regards to his medication LevETIRAcetam (KEPPRA XR). He stated his pharmacy received a refill prescription for 1500 mg but he is requesting a new prescription for 2000 mg.         Siddhi Dornbush Thedore MinsSingh  [facility]  Patient Line: [phone]  Back Line: [phone]

## 2017-07-10 IMAGING — CR DG CHEST 2V
2 series · 2 of 2 positions shown · non-contrast
Comparison: None.

CLINICAL DATA: Multiple seizures today.

EXAM:
CHEST  2 VIEW

[chest lat]
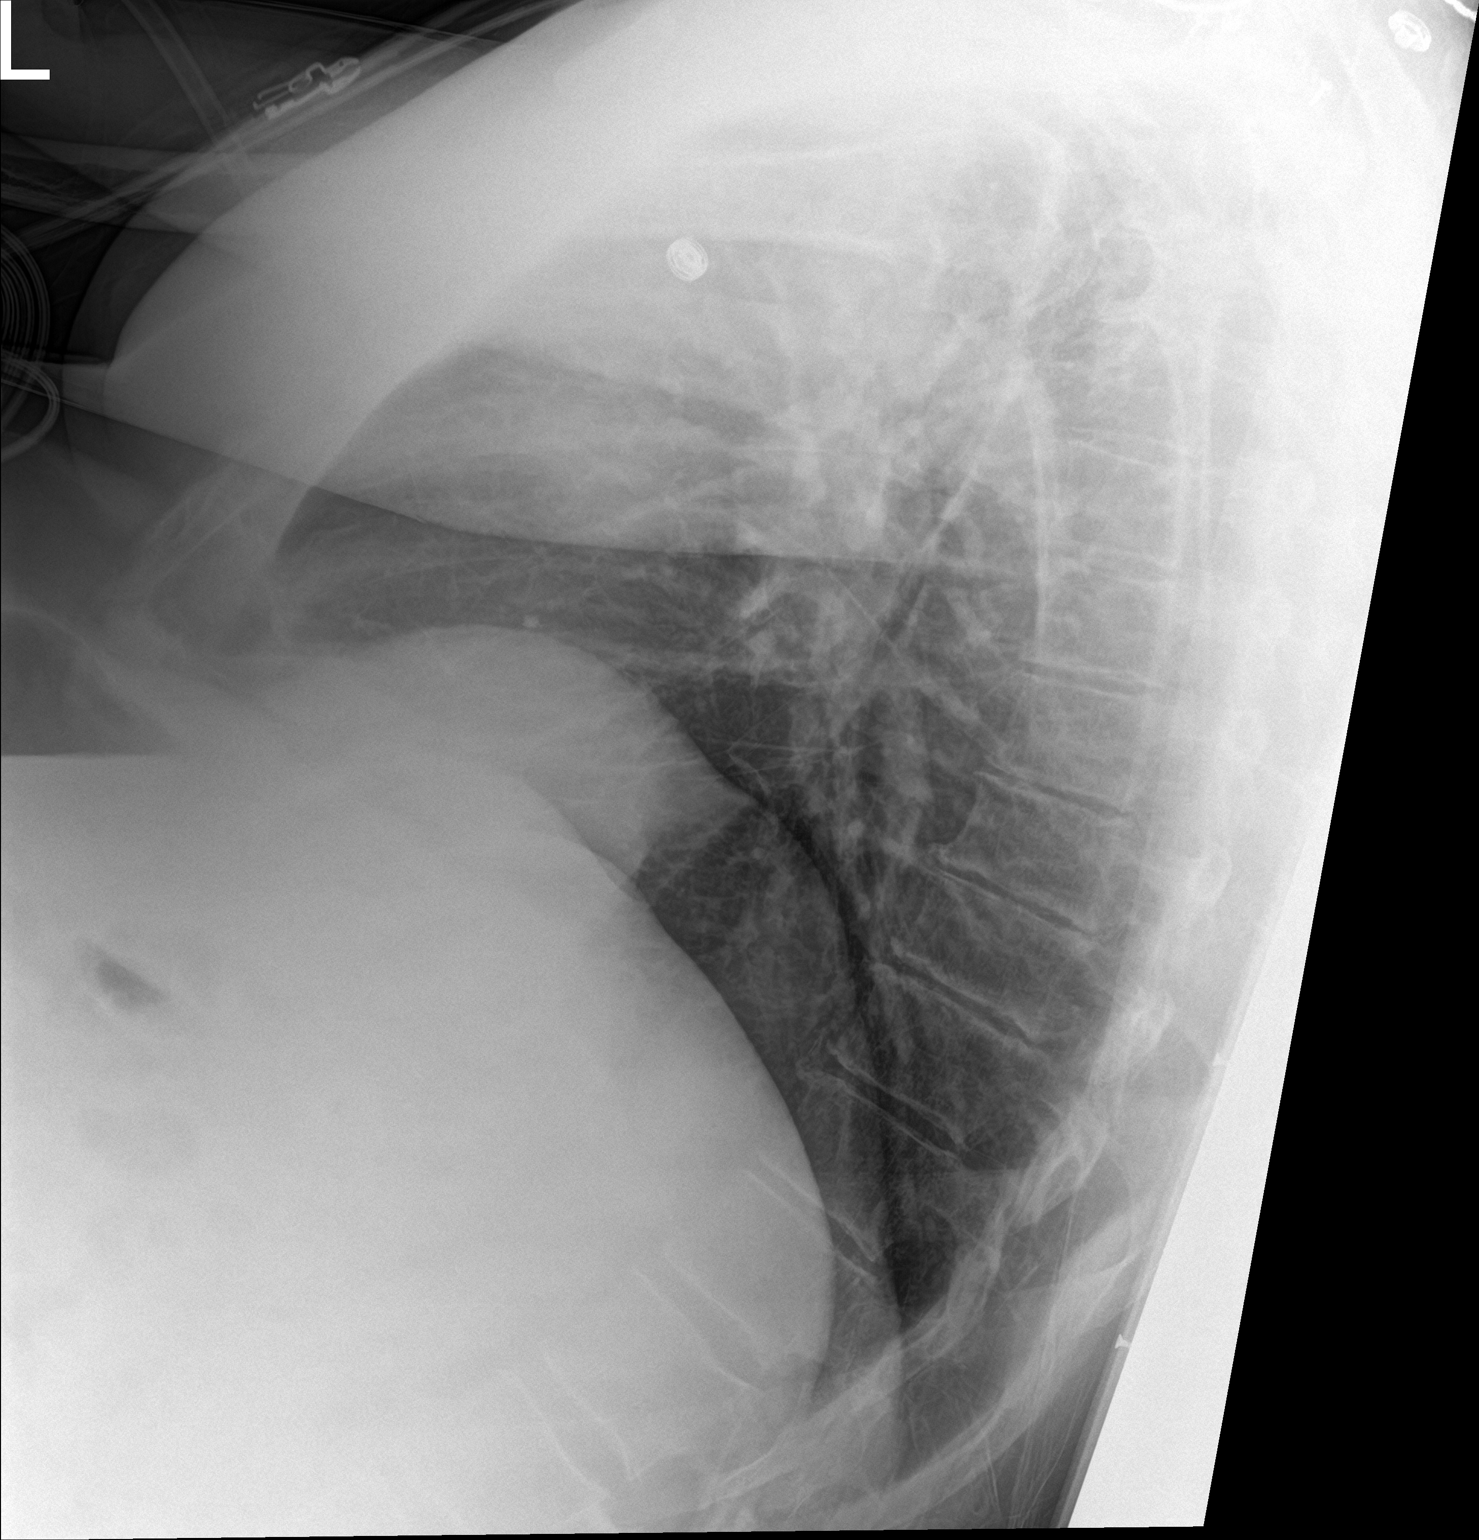

[chest ap]
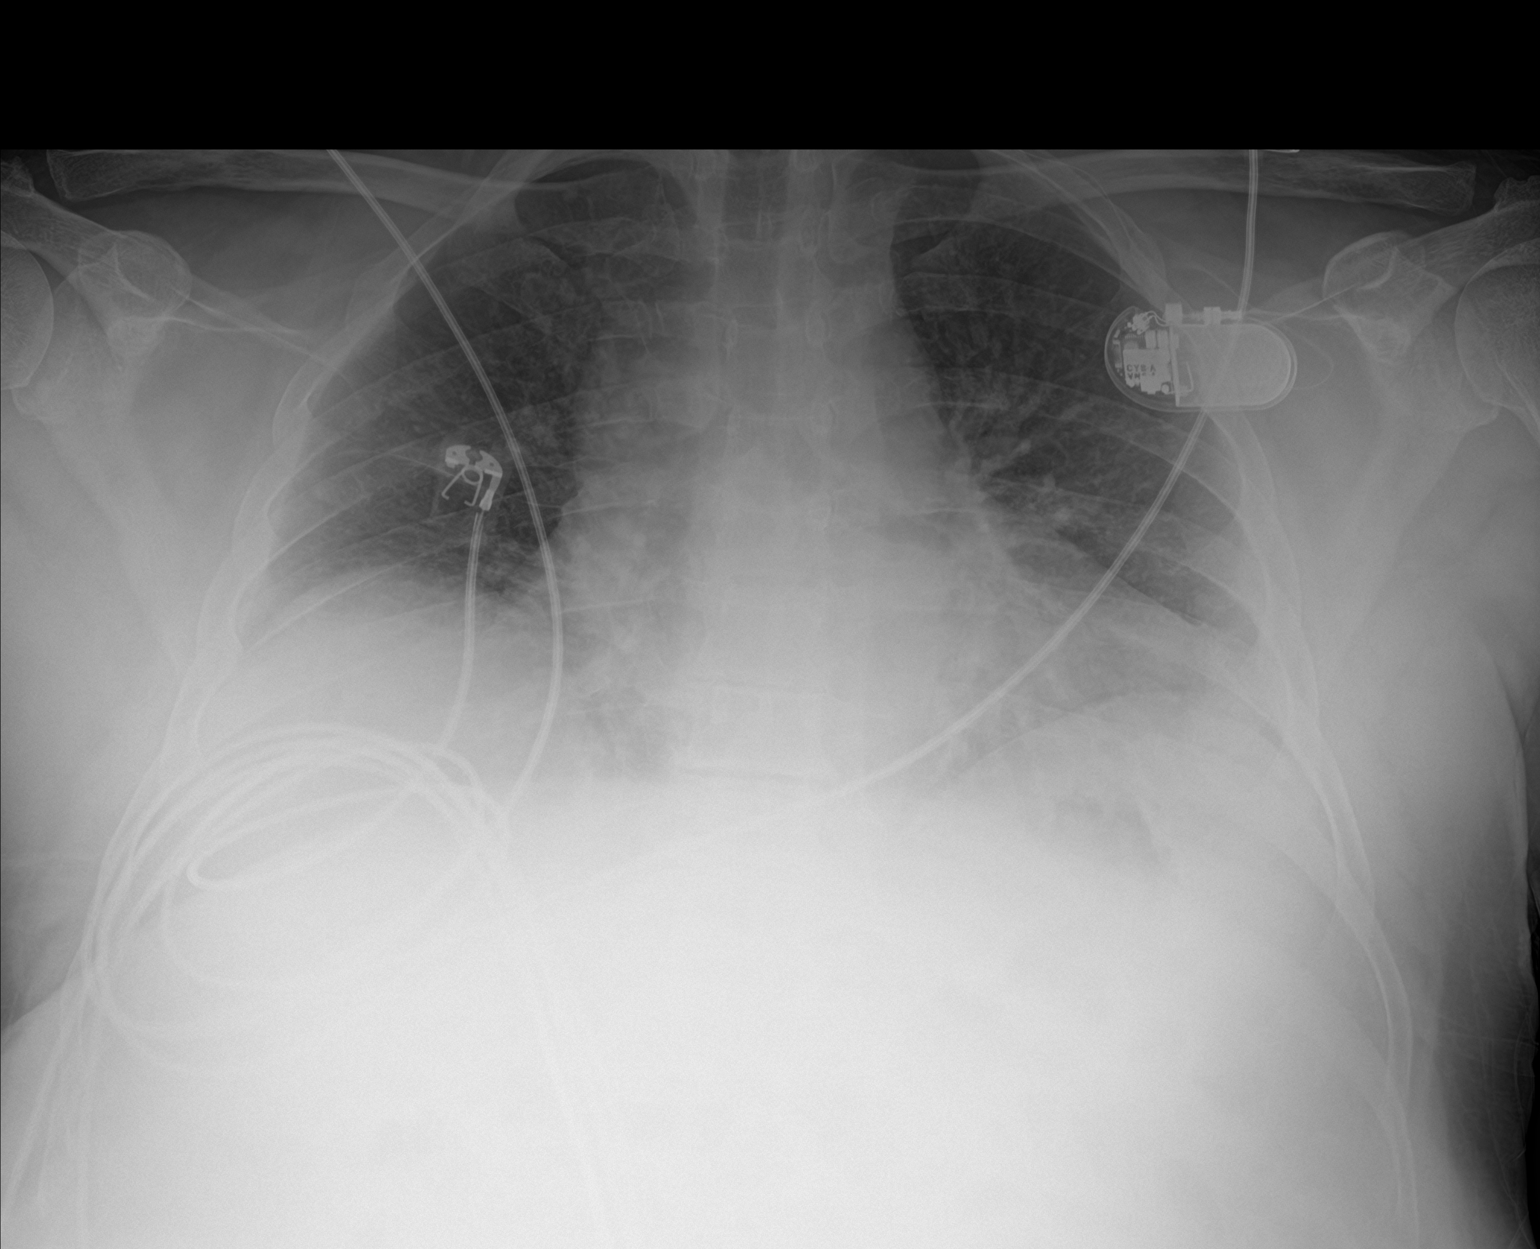

[2 of 2 positions shown; findings below may reference images not displayed]

FINDINGS: Shallow inspiration with atelectasis in the lung bases. Borderline
heart size without vascular congestion or edema. No blunting of
costophrenic angles. No pneumothorax. Probable nerve stimulator
device positioned over the left chest with leads in the left lower
cervical region.
IMPRESSION: Shallow inspiration with atelectasis in the lung bases. Mild cardiac
enlargement. No focal consolidation or edema.
